# Patient Record
Sex: Male | Born: 1940 | Race: White | Hispanic: No | Marital: Single | Smoking: Former smoker
Health system: Southern US, Community
[De-identification: ages and names within clinical notes are randomized; demographics above are authoritative.]

## PROBLEM LIST (undated history)

## (undated) DIAGNOSIS — I429 Cardiomyopathy, unspecified: Secondary | ICD-10-CM

## (undated) DIAGNOSIS — K56609 Unspecified intestinal obstruction, unspecified as to partial versus complete obstruction: Secondary | ICD-10-CM

## (undated) DIAGNOSIS — M109 Gout, unspecified: Secondary | ICD-10-CM

## (undated) DIAGNOSIS — I10 Essential (primary) hypertension: Secondary | ICD-10-CM

## (undated) DIAGNOSIS — K469 Unspecified abdominal hernia without obstruction or gangrene: Secondary | ICD-10-CM

## (undated) DIAGNOSIS — I4891 Unspecified atrial fibrillation: Secondary | ICD-10-CM

## (undated) DIAGNOSIS — E785 Hyperlipidemia, unspecified: Secondary | ICD-10-CM

## (undated) DIAGNOSIS — M1990 Unspecified osteoarthritis, unspecified site: Secondary | ICD-10-CM

## (undated) HISTORY — PX: VENTRAL HERNIA REPAIR: SHX424

## (undated) HISTORY — PX: APPENDECTOMY: SHX54

## (undated) HISTORY — DX: Unspecified osteoarthritis, unspecified site: M19.90

## (undated) HISTORY — DX: Hyperlipidemia, unspecified: E78.5

## (undated) HISTORY — DX: Essential (primary) hypertension: I10

## (undated) HISTORY — PX: EYE SURGERY: SHX253

## (undated) HISTORY — DX: Cardiomyopathy, unspecified: I42.9

## (undated) HISTORY — DX: Gout, unspecified: M10.9

---

## 2007-02-14 ENCOUNTER — Observation Stay (HOSPITAL_COMMUNITY): Admission: EM | Admit: 2007-02-14 | Discharge: 2007-02-15 | Payer: Self-pay | Admitting: Emergency Medicine

## 2007-02-14 ENCOUNTER — Encounter (INDEPENDENT_AMBULATORY_CARE_PROVIDER_SITE_OTHER): Payer: Self-pay | Admitting: General Surgery

## 2007-03-29 ENCOUNTER — Observation Stay (HOSPITAL_COMMUNITY): Admission: RE | Admit: 2007-03-29 | Discharge: 2007-03-30 | Payer: Self-pay | Admitting: General Surgery

## 2009-08-06 ENCOUNTER — Emergency Department (HOSPITAL_COMMUNITY): Admission: EM | Admit: 2009-08-06 | Discharge: 2009-08-07 | Payer: Self-pay | Admitting: Emergency Medicine

## 2009-12-26 ENCOUNTER — Inpatient Hospital Stay (HOSPITAL_COMMUNITY): Admission: EM | Admit: 2009-12-26 | Discharge: 2009-12-28 | Payer: Self-pay | Admitting: Emergency Medicine

## 2010-07-02 ENCOUNTER — Emergency Department (HOSPITAL_COMMUNITY)
Admission: EM | Admit: 2010-07-02 | Discharge: 2010-07-02 | Disposition: A | Discharge status: Home / Self Care | Payer: Medicare PPO | Attending: Emergency Medicine | Admitting: Emergency Medicine

## 2010-07-02 ENCOUNTER — Emergency Department (HOSPITAL_COMMUNITY): Payer: Medicare PPO

## 2010-07-02 DIAGNOSIS — M25469 Effusion, unspecified knee: Secondary | ICD-10-CM | POA: Insufficient documentation

## 2010-07-02 DIAGNOSIS — M25569 Pain in unspecified knee: Secondary | ICD-10-CM | POA: Insufficient documentation

## 2010-07-02 DIAGNOSIS — E785 Hyperlipidemia, unspecified: Secondary | ICD-10-CM | POA: Insufficient documentation

## 2010-07-02 DIAGNOSIS — Z79899 Other long term (current) drug therapy: Secondary | ICD-10-CM | POA: Insufficient documentation

## 2010-07-02 DIAGNOSIS — I10 Essential (primary) hypertension: Secondary | ICD-10-CM | POA: Insufficient documentation

## 2010-07-02 DIAGNOSIS — M109 Gout, unspecified: Secondary | ICD-10-CM | POA: Insufficient documentation

## 2010-07-04 LAB — COMPREHENSIVE METABOLIC PANEL
AST: 26 U/L (ref 0–37)
AST: 30 U/L (ref 0–37)
Albumin: 3.6 g/dL (ref 3.5–5.2)
Alkaline Phosphatase: 47 U/L (ref 39–117)
Alkaline Phosphatase: 49 U/L (ref 39–117)
Alkaline Phosphatase: 59 U/L (ref 39–117)
CO2: 25 mEq/L (ref 19–32)
CO2: 26 mEq/L (ref 19–32)
CO2: 28 mEq/L (ref 19–32)
Calcium: 10.1 mg/dL (ref 8.4–10.5)
Chloride: 105 mEq/L (ref 96–112)
Chloride: 107 mEq/L (ref 96–112)
Creatinine, Ser: 1.04 mg/dL (ref 0.4–1.5)
Creatinine, Ser: 1.38 mg/dL (ref 0.4–1.5)
Creatinine, Ser: 1.48 mg/dL (ref 0.4–1.5)
GFR calc Af Amer: >60 mL/min (ref >60)
GFR calc non Af Amer: 47 mL/min — ABNORMAL LOW (ref >60)
Glucose, Bld: 100 mg/dL — ABNORMAL HIGH (ref 70–99)
Potassium: 4.2 mEq/L (ref 3.5–5.1)
Potassium: 4.7 mEq/L (ref 3.5–5.1)
Sodium: 138 mEq/L (ref 135–145)
Sodium: 139 mEq/L (ref 135–145)
Total Bilirubin: 0.6 mg/dL (ref 0.3–1.2)
Total Bilirubin: 0.8 mg/dL (ref 0.3–1.2)
Total Protein: 7.2 g/dL (ref 6.0–8.3)

## 2010-07-04 LAB — URINALYSIS, ROUTINE W REFLEX MICROSCOPIC
Hgb urine dipstick: NEGATIVE
Ketones, ur: 15 mg/dL — AB
Nitrite: NEGATIVE
Specific Gravity, Urine: >1.03 — ABNORMAL HIGH (ref 1.005–1.030)

## 2010-07-04 LAB — CBC
Hemoglobin: 12.3 g/dL — ABNORMAL LOW (ref 13.0–17.0)
MCH: 30.3 pg (ref 26.0–34.0)
MCH: 30.9 pg (ref 26.0–34.0)
MCV: 90.9 fL (ref 78.0–100.0)
Platelets: 280 10*3/uL (ref 150–400)
RBC: 3.98 MIL/uL — ABNORMAL LOW (ref 4.22–5.81)
RBC: 4.27 MIL/uL (ref 4.22–5.81)
RDW: 15.2 % (ref 11.5–15.5)
WBC: 7.2 10*3/uL (ref 4.0–10.5)

## 2010-07-04 LAB — DIFFERENTIAL
Basophils Absolute: 0 10*3/uL (ref 0.0–0.1)
Basophils Relative: 1 % (ref 0–1)
Eosinophils Absolute: 0 10*3/uL (ref 0.0–0.7)
Eosinophils Relative: 5 % (ref 0–5)
Lymphocytes Relative: 26 % (ref 12–46)
Lymphs Abs: 1.1 10*3/uL (ref 0.7–4.0)
Monocytes Absolute: 0.9 10*3/uL (ref 0.1–1.0)
Monocytes Relative: 11 % (ref 3–12)
Monocytes Relative: 6 % (ref 3–12)
Neutrophils Relative %: 58 % (ref 43–77)

## 2010-07-04 LAB — RAPID URINE DRUG SCREEN, HOSP PERFORMED
Amphetamines: NOT DETECTED
Benzodiazepines: NOT DETECTED
Cocaine: NOT DETECTED
Opiates: NOT DETECTED

## 2010-07-09 LAB — CBC
HCT: 45.6 % (ref 39.0–52.0)
MCHC: 34.5 g/dL (ref 30.0–36.0)
Platelets: 242 10*3/uL (ref 150–400)
RDW: 14.5 % (ref 11.5–15.5)

## 2010-07-09 LAB — DIFFERENTIAL
Eosinophils Relative: 1 % (ref 0–5)
Lymphocytes Relative: 11 % — ABNORMAL LOW (ref 12–46)
Lymphs Abs: 1.6 10*3/uL (ref 0.7–4.0)
Monocytes Relative: 6 % (ref 3–12)
Neutro Abs: 12.4 10*3/uL — ABNORMAL HIGH (ref 1.7–7.7)

## 2010-07-09 LAB — BASIC METABOLIC PANEL
Chloride: 103 mEq/L (ref 96–112)
GFR calc Af Amer: 57 mL/min — ABNORMAL LOW (ref >60)
GFR calc non Af Amer: 47 mL/min — ABNORMAL LOW (ref >60)

## 2010-09-03 NOTE — Op Note (Signed)
NAMEEUGUNE, SINE                 ACCOUNT NO.:  [id number]   MEDICAL RECORD NO.:  [id number]          PATIENT TYPE:  INP   LOCATION:  A331                          FACILITY:  [facility]   PHYSICIAN:  Dalia Heading, M.D.  DATE OF BIRTH:  [birth year]   DATE OF PROCEDURE:  02/14/2007  DATE OF DISCHARGE:                               OPERATIVE REPORT   PREOPERATIVE DIAGNOSIS:  Acute appendicitis.   POSTOPERATIVE DIAGNOSIS:  Acute appendicitis, Meckel's diverticulum.   PROCEDURE:  Laparoscopic appendectomy, laparoscopic Meckel's  diverticulectomy.   SURGEON:  Dr. Franky Macho.   ANESTHESIA:  General endotracheal.   INDICATIONS:  The patient is a 70 year old white male who presents with  worsening right lower quadrant abdominal pain.  CT scan of the pelvis  reveals acute appendicitis.  Risks and benefits of the procedure  including bleeding, infection, and the possibly an open procedure were  fully explained to the patient, gave informed consent.   PROCEDURE NOTE:  The patient is placed in the supine position.  After  induction of general endotracheal anesthesia, the abdomen was prepped  and draped in the usual sterile technique with Betadine.  Surgical site  confirmation was performed.   The supraumbilical incision was made down to fascia.  The Veress needle  was introduced into the abdominal cavity and confirmation of placement  was done using the saline drop test.  The abdomen was then insufflated  to 16 mmHg pressure.  11 mm trocar was introduced into the abdominal  cavity under direct visualization without difficulty.  An additional 12-  mm trocars placed in suprapubic region and 5-mm trocars placed in the  right lower quadrant and left lower quadrant regions.  The appendix was  visualized and noted to be acutely inflamed without evidence of  perforation.  The mesoappendix was divided using the harmonic scalpel.  A standard Endo-GIA was placed across the base the appendix and  fired.  The appendix was then removed using the EndoCatch bag.  On further  inspection of the bowel, the patient was noted have a Meckel's  diverticulum with evidence of inflammation at its tip.  The Meckel's  diverticulum was excised using an Endo-GIA.  The Meckel's was moved  using an EndoCatch bag.  The staple line was inspected, noted to be  normal limits.  The right lower quadrant of the abdomen was copiously  irrigated with normal saline.  All fluid and air were then evacuated  from the abdominal cavity prior to removal of the trocars.   All wounds irrigated with normal saline.  All wounds were injected with  0.5% Sensorcaine.  The patient was noted to have an umbilical hernia and  omentum was reduced from this area.  It could not be repaired primarily  and was felt the patient would best have it repaired as an outpatient  and he has been asymptomatic.  The supraumbilical fascia was  reapproximated using an 0 Vicryl interrupted suture.  All skin incisions  were closed using staples.  Betadine ointment dry sterile dressings were  applied.   All tape and needle counts correct  the end of procedure.  The patient  was extubated in the operating room went back to recovery room awake in  stable condition.   COMPLICATIONS:  None.   SPECIMEN:  1. Appendix.  2. Meckel's diverticulum.   BLOOD LOSS:  Minimal.      Dalia Heading, M.D.  Electronically Signed     MAJ/MEDQ  D:  02/14/2007  T:  02/15/2007  Job:  [id number]   cc:   Joen Laura, MD  [address] [place], [place] [address]

## 2010-09-03 NOTE — H&P (Signed)
NAMECHRITOPHER, Bryce Cook                 ACCOUNT NO.:  [id number]   MEDICAL RECORD NO.:  [id number]          PATIENT TYPE:  AMB   LOCATION:  DAY                           FACILITY:  [facility]   PHYSICIAN:  Dalia Heading, M.D.  DATE OF BIRTH:  [birth year]   DATE OF ADMISSION:  03/29/2007  DATE OF DISCHARGE:  LH                              HISTORY & PHYSICAL   CHIEF COMPLAINT:  Umbilical hernia.   HISTORY OF PRESENT ILLNESS:  The patient is a 70 year old white male who  recently underwent a laparoscopic appendectomy and diverticulectomy for  acute appendicitis, who was also noted to have a large umbilical hernia.  It could not be repaired at that time due to the need for mesh placement  and the fact that the patient had acute appendicitis.  He now presents  for a laparoscopic umbilical herniorrhaphy.   PAST MEDICAL HISTORY:  1. Hypertension.  2. Gout.   PAST SURGICAL HISTORY:  As noted above.   CURRENT MEDICATIONS:  Flomax, Crestor, Diovan,  bisoprolol/hydrochlorothiazide, lisinopril, colchicine.   ALLERGIES:  No known drug allergies.   REVIEW OF SYSTEMS:  The patient denies any recent chest pain, MI, CVA or  diabetes mellitus.   PHYSICAL EXAMINATION:  The patient is a well-developed, well-nourished  white male in no acute distress.  LUNGS:  Clear to auscultation with equal breath sounds bilaterally.  HEART:  A regular rate and rhythm without S3, S4 or murmurs.  ABDOMEN:  Soft with a large, reducible umbilical hernia present.  No  hepatosplenomegaly or masses are noted.   IMPRESSION:  Umbilical hernia.   PLAN:  The patient is scheduled for a laparoscopic umbilical  herniorrhaphy with mesh on March 29, 2007.  The risks and benefits of  the procedure including bleeding, infection, bowel injury, recurrence of  the hernia, and the possibility of an open procedure, were fully  explained to the patient, who gave informed consent.      Dalia Heading, M.D.  Electronically  Signed     MAJ/MEDQ  D:  03/25/2007  T:  03/25/2007  Job:  [id number]   cc:   [facility]  Fax: [phone]   Marjean Donna  Fax: [phone]

## 2010-09-03 NOTE — Op Note (Signed)
NAMEDIRK, Bryce Cook                 ACCOUNT NO.:  [id number]   MEDICAL RECORD NO.:  [id number]          PATIENT TYPE:  OBV   LOCATION:  A325                          FACILITY:  [facility]   PHYSICIAN:  Dalia Heading, M.D.  DATE OF BIRTH:  20-Jul-1940   DATE OF PROCEDURE:  03/29/2007  DATE OF DISCHARGE:                               OPERATIVE REPORT   PREOPERATIVE DIAGNOSIS:  Ventral hernia.   POSTOPERATIVE DIAGNOSIS:  Ventral hernia.   PROCEDURE:  Laparoscopic ventral herniorrhaphy with mesh.   SURGEON:  Dalia Heading, MD   ANESTHESIA:  General endotracheal.   INDICATIONS:  The patient is a 70 year old morbidly obese white male who  presents with an umbilical/ventral hernia.  The risks and benefits of  the procedure including bleeding, infection, bowel injury, and the  possibly of an open procedure as well as recurrence of the hernia were  fully explained to the patient, who gave informed consent.   PROCEDURE NOTE:  The patient was placed in the supine position.  After  induction of general endotracheal anesthesia, the abdomen was prepped  and draped using the usual sterile technique with Betadine.  Surgical  site confirmation was performed.   An incision was made in the epigastric region and a Veress needle was  inserted into the abdominal cavity without difficulty.  Confirmation was  done using the saline drop test.  The abdomen was then insufflated to 16  mmHg pressure.  An 11-mm trocar was then introduced into the left upper  quadrant of the abdomen without difficulty.  An additional 5-mm trocar  was placed on the right side of the abdomen and left lower quadrant  region.  Omentum was noted be attached to the underside of the  umbilicus.  This was divided using the LigaSure.  Next, a 10 x 15-cm  Proceed mesh was then inserted.  It was tacked the abdominal wall in  four areas using 2-0 Novofil interrupted sutures.  A Pro Tacker was then  used to tack the edges to the underside  of the abdominal wall.  No  abnormal bleeding was noted at the end of the procedure.  All air was  evacuated from the abdominal cavity prior to the removal of the trocars.   All wounds were irrigated with normal saline.  All wounds were injected  with 0.5% Sensorcaine.  The 10-mm trocar site fascia was reapproximated  using a 0 Vicryl interrupted suture.  All skin incisions were closed  using staples.  Dermabond was applied to the stay sutures sites.   All tape and needle counts were correct at the end of the procedure.  The patient was extubated in the operating room and went back to the  recovery room awake, in stable condition.   COMPLICATIONS:  None.   SPECIMEN:  None.   BLOOD LOSS:  Minimal.      Dalia Heading, M.D.  Electronically Signed     MAJ/MEDQ  D:  03/29/2007  T:  03/30/2007  Job:  [id number]   cc:   Marjean Donna  Fax: [phone]

## 2011-01-27 LAB — BASIC METABOLIC PANEL
CO2: 29
Calcium: 9.6
Chloride: 104
Creatinine, Ser: 1
GFR calc Af Amer: >60
GFR calc non Af Amer: >60
Glucose, Bld: 99

## 2011-01-27 LAB — CBC
HCT: 38.6 — ABNORMAL LOW
Hemoglobin: 13
RBC: 4.38
WBC: 5.9

## 2011-01-27 LAB — DIFFERENTIAL
Basophils Absolute: 0
Lymphs Abs: 1.8
Monocytes Absolute: 0.8

## 2011-01-29 LAB — DIFFERENTIAL
Basophils Absolute: 0
Basophils Relative: 0
Eosinophils Relative: 1
Lymphocytes Relative: 9 — ABNORMAL LOW
Lymphs Abs: 0.5 — ABNORMAL LOW
Monocytes Absolute: 0.1 — ABNORMAL LOW
Neutro Abs: 8.3 — ABNORMAL HIGH
Neutrophils Relative %: 95 — ABNORMAL HIGH

## 2011-01-29 LAB — URINALYSIS, ROUTINE W REFLEX MICROSCOPIC
Bilirubin Urine: NEGATIVE
Hgb urine dipstick: NEGATIVE
Nitrite: NEGATIVE
Protein, ur: NEGATIVE
Urobilinogen, UA: 0.2

## 2011-01-29 LAB — BASIC METABOLIC PANEL
BUN: 22
BUN: 22
CO2: 25
Calcium: 9.5
Creatinine, Ser: 1.34
GFR calc Af Amer: 56 — ABNORMAL LOW
GFR calc non Af Amer: 46 — ABNORMAL LOW
Glucose, Bld: 141 — ABNORMAL HIGH
Potassium: 4.2
Sodium: 137

## 2011-01-29 LAB — CBC
HCT: 35.8 — ABNORMAL LOW
MCHC: 34.3
Platelets: 123 — ABNORMAL LOW
Platelets: 195
RDW: 14.1 — ABNORMAL HIGH
WBC: 5.9

## 2012-01-01 ENCOUNTER — Encounter: Payer: Self-pay | Admitting: Cardiology

## 2012-01-01 ENCOUNTER — Ambulatory Visit (INDEPENDENT_AMBULATORY_CARE_PROVIDER_SITE_OTHER): Payer: Medicare PPO | Admitting: Cardiology

## 2012-01-01 VITALS — BP 130/82 | HR 73 | Ht 71.0 in | Wt 249.0 lb

## 2012-01-01 DIAGNOSIS — E785 Hyperlipidemia, unspecified: Secondary | ICD-10-CM | POA: Insufficient documentation

## 2012-01-01 DIAGNOSIS — I429 Cardiomyopathy, unspecified: Secondary | ICD-10-CM

## 2012-01-01 DIAGNOSIS — I10 Essential (primary) hypertension: Secondary | ICD-10-CM

## 2012-01-01 DIAGNOSIS — E782 Mixed hyperlipidemia: Secondary | ICD-10-CM

## 2012-01-01 NOTE — Patient Instructions (Addendum)
Your physician recommends that you schedule a follow-up appointment in: 6 months  

## 2012-01-01 NOTE — Assessment & Plan Note (Signed)
Continue on statin therapy. Recent lipid numbers reviewed.

## 2012-01-01 NOTE — Progress Notes (Signed)
Clinical Summary Bryce Cook is a 71 y.o.male presents to establish cardiology followup. Outside records reviewed. 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 There is no definitive history of obstructive CAD or myocardial infarction as best I can tell.  Recent labwork showed potassium 5.2, BUN 16, creatinine 0.9, normal AST and ALT, cholesterol 189, triglycerides 245, HDL 51, and LDL 89. Exercise echocardiogram report from 1/13 showed no definite echocardiographic ischemic changes at 81% MPHR, EF 60%, PVC's, no diagnostic ST changes.  From a symptom perspective, he describes occasional exertional chest pain, although typically no more than once every few months. Has baseline NYHA class II dyspnea on exertion. No palpitations or syncope.  He reports compliance with his medications.   Not on File  Current Outpatient Prescriptions  Medication Sig Dispense Refill  . allopurinol (ZYLOPRIM) 100 MG tablet Take 200 mg by mouth daily.       Marland Kitchen amLODipine (NORVASC) 5 MG tablet Take 5 mg by mouth daily.       Marland Kitchen aspirin 325 MG EC tablet Take 325 mg by mouth every other day.      . fexofenadine (ALLEGRA) 180 MG tablet Take 90 mg by mouth daily.      Marland Kitchen lisinopril (PRINIVIL,ZESTRIL) 20 MG tablet Take 20 mg by mouth daily.      . CRESTOR 10 MG tablet Take 10 mg by mouth daily.         Past Medical History  Diagnosis Date  . Arthritis   . Hyperlipidemia   . Gout   . Essential hypertension, benign   . Cardiomyopathy     LVEF of 40-45% 2005, LVEF 60% 2013    Past Surgical History  Procedure Date  . Ventral hernia repair   . Appendectomy     Family History  Problem Relation Age of Onset  . Heart failure Mother   . Hypertension Mother   . Hyperlipidemia Mother   . Coronary artery disease Father     Died age 64 with MI  . Hypertension Father   . Hyperlipidemia Father     Social History [name]  reports that he quit smoking about 34 years ago. His smoking use included Cigarettes. He does not have any smokeless tobacco history on file. [name] reports that he drinks alcohol.  Review of Systems No palpitations or syncope. No reported bleeding episodes. No orthopnea or PND. Reports pain in his legs related to gout, no definite claudication. Otherwise negative.  Physical Examination Filed Vitals:   01/01/12 1436  BP: 130/82  Pulse: 73   Filed Weights   01/01/12 1436  Weight: 249 lb (112.946 kg)   Patient in no acute distress. HEENT: Conjunctiva and lids normal, oropharynx clear. Neck: Supple, no elevated JVP or carotid bruits, no thyromegaly. Lungs: Clear to auscultation, nonlabored breathing at rest. Cardiac: Regular rate and rhythm, no S3 1-2/6 systolic murmur at base, no pericardial rub. Abdomen: Soft, nontender, protuberant, bowel sounds present, no guarding or rebound. Extremities: No pitting edema, distal pulses 2+. Skin: Warm and dry. Musculoskeletal: No kyphosis. Neuropsychiatric: Alert and oriented x3, affect grossly appropriate.   Problem List and Plan   Cardiomyopathy secondary Based on review of outside records. Patient seems to be stable from a symptom perspective. He does have occasional chest pain symptoms, may have some associated underlying CAD, however exercise echocardiogram from January of this year was relatively low risk at 81% MPHR. For now I've  asked him to be observant for any escalation in symptoms that might warrant further evaluation. Otherwise continue observation on medical therapy.  Essential hypertension, benign No changes made to current regimen.  Mixed hyperlipidemia Continue on statin therapy. Recent lipid numbers reviewed.    Jonelle Sidle, M.D., F.A.C.C.

## 2012-01-01 NOTE — Assessment & Plan Note (Signed)
No changes made to current regimen. 

## 2012-01-01 NOTE — Assessment & Plan Note (Signed)
Based on review of outside records. Patient seems to be stable from a symptom perspective. He does have occasional chest pain symptoms, may have some associated underlying CAD, however exercise echocardiogram from January of this year was relatively low risk at 81% MPHR. For now I've asked him to be observant for any escalation in symptoms that might warrant further evaluation. Otherwise continue observation on medical therapy.

## 2012-04-03 ENCOUNTER — Inpatient Hospital Stay (HOSPITAL_COMMUNITY)
Admission: EM | Admit: 2012-04-03 | Discharge: 2012-04-05 | DRG: 389 | Disposition: A | Discharge status: Home / Self Care | Payer: Medicare PPO | Attending: General Surgery | Admitting: General Surgery

## 2012-04-03 ENCOUNTER — Emergency Department (HOSPITAL_COMMUNITY): Payer: Medicare PPO

## 2012-04-03 ENCOUNTER — Encounter (HOSPITAL_COMMUNITY): Payer: Self-pay

## 2012-04-03 DIAGNOSIS — I10 Essential (primary) hypertension: Secondary | ICD-10-CM | POA: Diagnosis present

## 2012-04-03 DIAGNOSIS — M109 Gout, unspecified: Secondary | ICD-10-CM | POA: Diagnosis present

## 2012-04-03 DIAGNOSIS — K56609 Unspecified intestinal obstruction, unspecified as to partial versus complete obstruction: Secondary | ICD-10-CM

## 2012-04-03 DIAGNOSIS — I428 Other cardiomyopathies: Secondary | ICD-10-CM | POA: Diagnosis present

## 2012-04-03 DIAGNOSIS — K565 Intestinal adhesions [bands], unspecified as to partial versus complete obstruction: Principal | ICD-10-CM | POA: Diagnosis present

## 2012-04-03 DIAGNOSIS — Z87891 Personal history of nicotine dependence: Secondary | ICD-10-CM

## 2012-04-03 DIAGNOSIS — Z6834 Body mass index (BMI) 34.0-34.9, adult: Secondary | ICD-10-CM

## 2012-04-03 DIAGNOSIS — E785 Hyperlipidemia, unspecified: Secondary | ICD-10-CM | POA: Diagnosis present

## 2012-04-03 DIAGNOSIS — E669 Obesity, unspecified: Secondary | ICD-10-CM | POA: Diagnosis present

## 2012-04-03 DIAGNOSIS — M129 Arthropathy, unspecified: Secondary | ICD-10-CM | POA: Diagnosis present

## 2012-04-03 LAB — URINALYSIS, ROUTINE W REFLEX MICROSCOPIC
Glucose, UA: NEGATIVE mg/dL
Ketones, ur: 15 mg/dL — AB
Leukocytes, UA: NEGATIVE
pH: 6 (ref 5.0–8.0)

## 2012-04-03 LAB — CBC WITH DIFFERENTIAL/PLATELET
Eosinophils Relative: 0 % (ref 0–5)
HCT: 46.5 % (ref 39.0–52.0)
Lymphocytes Relative: 12 % (ref 12–46)
Lymphs Abs: 1.4 10*3/uL (ref 0.7–4.0)
MCV: 89.3 fL (ref 78.0–100.0)
Monocytes Absolute: 1.1 10*3/uL — ABNORMAL HIGH (ref 0.1–1.0)
Monocytes Relative: 9 % (ref 3–12)
RBC: 5.21 MIL/uL (ref 4.22–5.81)
RDW: 14.5 % (ref 11.5–15.5)
WBC: 11.8 10*3/uL — ABNORMAL HIGH (ref 4.0–10.5)

## 2012-04-03 LAB — URINE MICROSCOPIC-ADD ON

## 2012-04-03 LAB — BASIC METABOLIC PANEL
BUN: 25 mg/dL — ABNORMAL HIGH (ref 6–23)
CO2: 29 mEq/L (ref 19–32)
Calcium: 10.1 mg/dL (ref 8.4–10.5)
Creatinine, Ser: 1.19 mg/dL (ref 0.50–1.35)
Glucose, Bld: 138 mg/dL — ABNORMAL HIGH (ref 70–99)

## 2012-04-03 MED ORDER — MORPHINE SULFATE 4 MG/ML IJ SOLN
4.0000 mg | Freq: Once | INTRAMUSCULAR | Status: AC
  Administered 2012-04-03: 4 mg via INTRAVENOUS
  Filled 2012-04-03: qty 1

## 2012-04-03 MED ORDER — PANTOPRAZOLE SODIUM 40 MG IV SOLR
40.0000 mg | Freq: Every day | INTRAVENOUS | Status: DC
  Administered 2012-04-03 – 2012-04-04 (×2): 40 mg via INTRAVENOUS
  Filled 2012-04-03 (×2): qty 40

## 2012-04-03 MED ORDER — LACTATED RINGERS IV SOLN
INTRAVENOUS | Status: DC
  Administered 2012-04-03 – 2012-04-05 (×4): via INTRAVENOUS

## 2012-04-03 MED ORDER — ENOXAPARIN SODIUM 40 MG/0.4ML ~~LOC~~ SOLN
40.0000 mg | SUBCUTANEOUS | Status: DC
  Administered 2012-04-03 – 2012-04-04 (×2): 40 mg via SUBCUTANEOUS
  Filled 2012-04-03 (×2): qty 0.4

## 2012-04-03 MED ORDER — ONDANSETRON HCL 4 MG/2ML IJ SOLN
INTRAMUSCULAR | Status: AC
  Filled 2012-04-03: qty 2

## 2012-04-03 MED ORDER — ONDANSETRON HCL 4 MG/2ML IJ SOLN
4.0000 mg | Freq: Once | INTRAMUSCULAR | Status: AC
  Administered 2012-04-03: 4 mg via INTRAVENOUS
  Filled 2012-04-03: qty 2

## 2012-04-03 MED ORDER — ONDANSETRON HCL 4 MG/2ML IJ SOLN
4.0000 mg | Freq: Four times a day (QID) | INTRAMUSCULAR | Status: DC | PRN
  Administered 2012-04-03 (×2): 4 mg via INTRAVENOUS
  Filled 2012-04-03: qty 2

## 2012-04-03 MED ORDER — ENOXAPARIN SODIUM 40 MG/0.4ML ~~LOC~~ SOLN
SUBCUTANEOUS | Status: AC
  Filled 2012-04-03: qty 0.4

## 2012-04-03 MED ORDER — MORPHINE SULFATE 2 MG/ML IJ SOLN
1.0000 mg | INTRAMUSCULAR | Status: DC | PRN
  Administered 2012-04-03: 4 mg via INTRAVENOUS
  Administered 2012-04-03: 2 mg via INTRAVENOUS
  Filled 2012-04-03: qty 2
  Filled 2012-04-03: qty 1

## 2012-04-03 MED ORDER — ONDANSETRON HCL 4 MG/2ML IJ SOLN: 4.0000 mg | Freq: Once | INTRAMUSCULAR | Status: DC

## 2012-04-03 MED ORDER — SODIUM CHLORIDE 0.9 % IV BOLUS (SEPSIS)
700.0000 mL | Freq: Once | INTRAVENOUS | Status: AC
  Administered 2012-04-03: 1000 mL via INTRAVENOUS

## 2012-04-03 MED ORDER — IOHEXOL 300 MG/ML  SOLN
100.0000 mL | Freq: Once | INTRAMUSCULAR | Status: AC | PRN
  Administered 2012-04-03: 100 mL via INTRAVENOUS

## 2012-04-03 NOTE — ED Notes (Signed)
Bladder scan was completed on pt. Scan measured 0(zero) ml. RN made aware

## 2012-04-03 NOTE — H&P (Signed)
Bryce Cook is an 71 y.o. male.   Chief Complaint: Nausea and vomiting HPI: Patient presents to [facility] emergency department with increasing nausea, one episode of emesis and decreased bowel function. He has had some colicky abdominal discomfort. This is progress over the last 48 hours. He has not had any hematemesis. He did pass some flatus yesterday but has not had any significant bowel movement over the last several days. He denies any melena or hematochezia with his last bowel movement. He did have a similar episode approximately one year ago. This resolved with conservative management. Currently patient denies any abdominal pain. He denies any fevers or chills.  Past Medical History  Diagnosis Date  . Arthritis   . Hyperlipidemia   . Gout   . Essential hypertension, benign   . Cardiomyopathy     LVEF of 40-45% 2005, LVEF 60% 2013    Past Surgical History  Procedure Date  . Ventral hernia repair   . Appendectomy     Family History  Problem Relation Age of Onset  . Heart failure Mother   . Hypertension Mother   . Hyperlipidemia Mother   . Coronary artery disease Father     Died age 41 with MI  . Hypertension Father   . Hyperlipidemia Father    Social History:  reports that he quit smoking about 34 years ago. His smoking use included Cigarettes. He does not have any smokeless tobacco history on file. He reports that he drinks alcohol. He reports that he does not use illicit drugs.  Allergies: No Known Allergies   (Not in a hospital admission)  Results for orders placed during the hospital encounter of 04/03/12 (from the past 48 hour(s))  CBC WITH DIFFERENTIAL     Status: Abnormal   Collection Time   04/03/12  9:21 AM      Component Value Range Comment   WBC 11.8 (*) 4.0 - 10.5 K/uL    RBC 5.21  4.22 - 5.81 MIL/uL    Hemoglobin 16.0  13.0 - 17.0 g/dL    HCT 69.6  29.5 - 28.4 %    MCV 89.3  78.0 - 100.0 fL    MCH 30.7  26.0 - 34.0 pg    MCHC 34.4  30.0 - 36.0 g/dL     RDW 13.2  44.0 - 10.2 %    Platelets 245  150 - 400 K/uL    Neutrophils Relative 78 (*) 43 - 77 %    Neutro Abs 9.3 (*) 1.7 - 7.7 K/uL    Lymphocytes Relative 12  12 - 46 %    Lymphs Abs 1.4  0.7 - 4.0 K/uL    Monocytes Relative 9  3 - 12 %    Monocytes Absolute 1.1 (*) 0.1 - 1.0 K/uL    Eosinophils Relative 0  0 - 5 %    Eosinophils Absolute 0.0  0.0 - 0.7 K/uL    Basophils Relative 0  0 - 1 %    Basophils Absolute 0.0  0.0 - 0.1 K/uL   BASIC METABOLIC PANEL     Status: Abnormal   Collection Time   04/03/12  9:21 AM      Component Value Range Comment   Sodium 136  135 - 145 mEq/L    Potassium 4.0  3.5 - 5.1 mEq/L    Chloride 96  96 - 112 mEq/L    CO2 29  19 - 32 mEq/L    Glucose, Bld 138 (*) 70 -  99 mg/dL    BUN 25 (*) 6 - 23 mg/dL    Creatinine, Ser 1.61  0.50 - 1.35 mg/dL    Calcium 09.6  8.4 - 10.5 mg/dL    GFR calc non Af Amer 60 (*) >90 mL/min    GFR calc Af Amer 70 (*) >90 mL/min   TROPONIN I     Status: Normal   Collection Time   04/03/12  9:21 AM      Component Value Range Comment   Troponin I <0.30  <0.30 ng/mL   URINALYSIS, ROUTINE W REFLEX MICROSCOPIC     Status: Abnormal   Collection Time   04/03/12 10:30 AM      Component Value Range Comment   Color, Urine YELLOW  YELLOW    APPearance HAZY (*) CLEAR    Specific Gravity, Urine 1.025  1.005 - 1.030    pH 6.0  5.0 - 8.0    Glucose, UA NEGATIVE  NEGATIVE mg/dL    Hgb urine dipstick NEGATIVE  NEGATIVE    Bilirubin Urine SMALL (*) NEGATIVE    Ketones, ur 15 (*) NEGATIVE mg/dL    Protein, ur 30 (*) NEGATIVE mg/dL    Urobilinogen, UA 0.2  0.0 - 1.0 mg/dL    Nitrite NEGATIVE  NEGATIVE    Leukocytes, UA NEGATIVE  NEGATIVE   URINE MICROSCOPIC-ADD ON     Status: Abnormal   Collection Time   04/03/12 10:30 AM      Component Value Range Comment   WBC, UA 7-10  <3 WBC/hpf    Bacteria, UA FEW (*) RARE    Casts HYALINE CASTS (*) NEGATIVE    Ct Abdomen Pelvis W Contrast  04/03/2012  *RADIOLOGY REPORT*   Clinical Data: Lower abdominal pain.  Previous umbilical hernia repair.  Small bowel obstruction.  CT ABDOMEN AND PELVIS WITH CONTRAST  Technique:  Multidetector CT imaging of the abdomen and pelvis was performed following the standard protocol during bolus administration of intravenous contrast.  Contrast: OMNIPAQUE IOHEXOL 300 MG/ML  SOLN  Comparison: 12/26/2009  Findings: Mild to moderate dilatation of proximal small bowel loops is seen with air-fluid levels.  Small bowel feces sign is seen, with transition point in the periumbilical region just deep to surgical mesh in the anterior abdominal wall.  Distal small bowel is nondilated.  This is consistent with a small bowel obstruction likely due to an adhesion.  No soft tissue mass or inflammatory process identified.  Minimal free fluid is noted the pelvis.  No evidence of bowel wall thickening or pneumatosis.  No evidence of inflammatory process or abscess.  Colonic diverticulosis is seen, however there is no evidence of diverticulitis.  Diffuse hepatic steatosis is demonstrated, but no liver mass identified.  Gallbladder is unremarkable.  The pancreas, spleen, adrenal glands, and kidneys are normal in appearance.  No evidence of hydronephrosis.  IMPRESSION:  1.  Distal small bowel obstruction, with transition point in the periumbilical region just deep to the internal abdominal wall surgical mesh.  This is consistent with an adhesion. 2.  Small amount of free fluid.  No evidence of inflammatory process or abscess. 3.  Diverticulosis.  No radiographic evidence of diverticulitis. 4.  Hepatic steatosis.   Original Report Authenticated By: Myles Rosenthal, M.D.    Dg Abd Acute W/chest  04/03/2012  *RADIOLOGY REPORT*  Clinical Data: Lower abdominal pain, constipation  ACUTE ABDOMEN SERIES (ABDOMEN 2 VIEW & CHEST 1 VIEW)  Comparison: CT abdomen pelvis dated 12/26/2009  Findings: Lungs are  clear. No pleural effusion or pneumothorax.  Mild cardiomegaly, although the  appearance is exacerbated by prominent epicardial fat when correlating with prior CT.  Mildly dilated loops of small bowel in the left mid abdomen, measuring up to 4.5 cm, with multiple air-fluid levels on the upright radiograph.  This appearance is suspicious for small bowel obstruction.  No evidence of free air under the diaphragm on the upright view.  Prior right ventral hernia mesh repair.  IMPRESSION: No evidence of acute cardiopulmonary disease.  Findings suspicious for small bowel obstruction.  No free air.   Original Report Authenticated By: Charline Bills, M.D.     Review of Systems  Constitutional: Positive for chills. Negative for fever, weight loss, malaise/fatigue and diaphoresis.  HENT: Negative.   Eyes: Negative.   Respiratory: Negative.   Cardiovascular: Negative.   Gastrointestinal: Positive for nausea and vomiting. Negative for abdominal pain, diarrhea, constipation, blood in stool and melena.  Genitourinary: Negative.   Musculoskeletal: Negative.   Skin: Negative.   Neurological: Negative.  Negative for weakness.  Endo/Heme/Allergies: Negative.   Psychiatric/Behavioral: Negative.     Blood pressure 128/78, pulse 74, temperature 98.7 F (37.1 C), temperature source Oral, resp. rate 20, SpO2 94.00%. Physical Exam  Constitutional: He is oriented to person, place, and time. He appears well-developed and well-nourished. No distress.       Obese  HENT:  Head: Normocephalic and atraumatic.  Eyes: Conjunctivae normal and EOM are normal. Pupils are equal, round, and reactive to light. No scleral icterus.  Neck: Normal range of motion. Neck supple. No tracheal deviation present. No thyromegaly present.  Cardiovascular: Normal rate, regular rhythm and normal heart sounds.   Respiratory: Effort normal and breath sounds normal.  GI: Soft. He exhibits distension. He exhibits no mass. There is no tenderness. There is no rebound and no guarding.       Tympanic  Musculoskeletal:  Normal range of motion.  Lymphadenopathy:    He has no cervical adenopathy.  Neurological: He is alert and oriented to person, place, and time.  Skin: Skin is warm and dry.     Assessment/Plan Small bowel obstruction. At this time patient will be continued in an n.p.o. status. Continue IV fluid hydration. Surgical indications were discussed with the patient. Patient agrees and wishes to proceed with conservative management if possible. We'll continue DVT prophylaxis. Inpatient admission.  Jilberto Vanderwall C 04/03/2012, 2:16 PM

## 2012-04-03 NOTE — ED Provider Notes (Signed)
History   This chart was scribed for Ward Givens, MD by Melba Coon, ED Scribe. The patient was seen in room APA08/APA08 and the patient's care was started at 9:30AM.    CSN: [id number]  Arrival date & time 04/03/12  0855   None     Chief Complaint  Patient presents with  . Abdominal Pain    (Consider location/radiation/quality/duration/timing/severity/associated sxs/prior treatment) The history is provided by the patient. No language interpreter was used.   Bryce Cook is a 71 y.o. male who presents to the Emergency Department complaining of intermittent, moderate peri-umbilical abdominal pain with a gradual onset 2:00-3:00PM yesterday that has gotten progressively worse. When the pain is present,the pain last 45 seconds and occurs every 5 minutes. He reports constipation for the past couple of weeks; last BM was this morning and stool was hard to come out with straining and was hard in consistancy. Denies change in medications or diet.  Movement or change in posittions aggravate the pain. He reports slow urine flow. Denies HA, fever, neck pain, sore throat, rash, back pain, CP, SOB, nausea, emesis, diarrhea, dysuria, or extremity pain, edema, weakness, numbness, or tingling. He was started  on supplemental O2 (2L, [place]) at home when he sleeps that was started in the past couple of weeks. He has had this pain before and was told it was gas pains.    No known allergies. No other pertinent medical symptoms.  PCP: Dr. Verlan Friends in [place], [place] Cardio: Dr. Diona Browner (was sent  for cardiomegaly) next appt in March  Past Medical History  Diagnosis Date  . Arthritis   . Hyperlipidemia   . Gout   . Essential hypertension, benign   . Cardiomyopathy     LVEF of 40-45% 2005, LVEF 60% 2013    Past Surgical History  Procedure Date  . Ventral hernia repair   . Appendectomy     Family History  Problem Relation Age of Onset  . Heart failure Mother   . Hypertension Mother   .  Hyperlipidemia Mother   . Coronary artery disease Father     Died age 107 with MI  . Hypertension Father   . Hyperlipidemia Father     History  Substance Use Topics  . Smoking status: Former Smoker    Types: Cigarettes    Quit date: 04/21/1977  . Smokeless tobacco: Not on file  . Alcohol Use: Yes     Comment: Occasionally   Lives at home Lives alone   Review of Systems 10 Systems reviewed and all are negative for acute change except as noted in the HPI.   Allergies  Review of patient's allergies indicates no known allergies.  Home Medications   Current Outpatient Rx  Name  Route  Sig  Dispense  Refill  . ALLOPURINOL 100 MG PO TABS   Oral   Take 200 mg by mouth daily.          Marland Kitchen AMLODIPINE BESYLATE 5 MG PO TABS   Oral   Take 5 mg by mouth daily.          . ASPIRIN 325 MG PO TBEC   Oral   Take 325 mg by mouth every other day.         . CRESTOR 10 MG PO TABS   Oral   Take 10 mg by mouth daily.          Marland Kitchen FEXOFENADINE HCL 180 MG PO TABS   Oral   Take 90  mg by mouth daily.         Marland Kitchen LISINOPRIL 20 MG PO TABS   Oral   Take 20 mg by mouth daily.           BP 135/51  Pulse 40  Temp 98.7 F (37.1 C) (Oral)  Resp 18  SpO2 96%  Vital signs normal except bradycardia (on monitor heart rate is actually in the 70s)   Physical Exam  Constitutional: He is oriented to person, place, and time. He appears well-developed and well-nourished.  Non-toxic appearance. He does not appear ill. No distress.  HENT:  Head: Normocephalic and atraumatic.  Right Ear: External ear normal.  Left Ear: External ear normal.  Nose: Nose normal. No mucosal edema or rhinorrhea.  Mouth/Throat: Oropharynx is clear and moist and mucous membranes are normal. No dental abscesses or uvula swelling.  Eyes: Conjunctivae normal and EOM are normal. Pupils are equal, round, and reactive to light.  Neck: Normal range of motion and full passive range of motion without pain. Neck  supple.  Cardiovascular: Normal rate, regular rhythm and normal heart sounds.  Exam reveals no gallop and no friction rub.   No murmur heard. Pulmonary/Chest: Effort normal and breath sounds normal. No respiratory distress. He has no wheezes. He has no rhonchi. He has no rales. He exhibits no tenderness and no crepitus.  Abdominal: Normal appearance and bowel sounds are normal. He exhibits no distension. There is Tenderness: suprapubic.. There is no rebound and no guarding.       Overall soft, but there is firmness lateral to the umbilicus c/w mesh  Musculoskeletal: Normal range of motion. He exhibits no edema and no tenderness.       Moves all extremities well.   Neurological: He is alert and oriented to person, place, and time. He has normal strength. No cranial nerve deficit.  Skin: Skin is warm, dry and intact. No rash noted. No erythema. No pallor.  Psychiatric: He has a normal mood and affect. His speech is normal and behavior is normal. His mood appears not anxious.    ED Course  Procedures (including critical care time)   Medications  morphine 2 MG/ML injection 1-4 mg (not administered)  ondansetron (ZOFRAN) injection 4 mg (4 mg Intravenous Given 04/03/12 1406)  pantoprazole (PROTONIX) injection 40 mg (not administered)  ondansetron (ZOFRAN) 4 MG/2ML injection (not administered)  ondansetron (ZOFRAN) injection 4 mg (not administered)  morphine 4 MG/ML injection 4 mg (4 mg Intravenous Given 04/03/12 1126)  ondansetron (ZOFRAN) injection 4 mg (4 mg Intravenous Given 04/03/12 1126)  sodium chloride 0.9 % bolus 700 mL (0 mL Intravenous Stopped 04/03/12 1145)  iohexol (OMNIPAQUE) 300 MG/ML solution 100 mL (100 mL Intravenous Contrast Given 04/03/12 1239)     DIAGNOSTIC STUDIES: Oxygen Saturation is 96% on room air, adequate by my interpretation.    COORDINATION OF CARE:  9:37AM - EKG, CXR, bladder scan, blood w/u, and UA will be ordered for Bryce Cook.   Bladder scan was  0  11:00AM - imaging results reviewed  11:20AM - lab results reviewed  11:33AM - recheck pain better after meds.  Discussed xray results and lab results. CT and more fluids will be ordered.  13:56 pt just vomited green liquid, his first episode of vomiting most likely from his contrast for his CT scan. Pt given results of his CT scan and need for admission.   13:57 Dr Leticia Penna will admit, states can wait for NG tube for now.  Results for orders placed during the hospital encounter of 04/03/12  CBC WITH DIFFERENTIAL      Component Value Range   WBC 11.8 (*) 4.0 - 10.5 K/uL   RBC 5.21  4.22 - 5.81 MIL/uL   Hemoglobin 16.0  13.0 - 17.0 g/dL   HCT 16.1  09.6 - 04.5 %   MCV 89.3  78.0 - 100.0 fL   MCH 30.7  26.0 - 34.0 pg   MCHC 34.4  30.0 - 36.0 g/dL   RDW 40.9  81.1 - 91.4 %   Platelets 245  150 - 400 K/uL   Neutrophils Relative 78 (*) 43 - 77 %   Neutro Abs 9.3 (*) 1.7 - 7.7 K/uL   Lymphocytes Relative 12  12 - 46 %   Lymphs Abs 1.4  0.7 - 4.0 K/uL   Monocytes Relative 9  3 - 12 %   Monocytes Absolute 1.1 (*) 0.1 - 1.0 K/uL   Eosinophils Relative 0  0 - 5 %   Eosinophils Absolute 0.0  0.0 - 0.7 K/uL   Basophils Relative 0  0 - 1 %   Basophils Absolute 0.0  0.0 - 0.1 K/uL  BASIC METABOLIC PANEL      Component Value Range   Sodium 136  135 - 145 mEq/L   Potassium 4.0  3.5 - 5.1 mEq/L   Chloride 96  96 - 112 mEq/L   CO2 29  19 - 32 mEq/L   Glucose, Bld 138 (*) 70 - 99 mg/dL   BUN 25 (*) 6 - 23 mg/dL   Creatinine, Ser 7.82  0.50 - 1.35 mg/dL   Calcium 95.6  8.4 - 21.3 mg/dL   GFR calc non Af Amer 60 (*) >90 mL/min   GFR calc Af Amer 70 (*) >90 mL/min  TROPONIN I      Component Value Range   Troponin I <0.30  <0.30 ng/mL  URINALYSIS, ROUTINE W REFLEX MICROSCOPIC      Component Value Range   Color, Urine YELLOW  YELLOW   APPearance HAZY (*) CLEAR   Specific Gravity, Urine 1.025  1.005 - 1.030   pH 6.0  5.0 - 8.0   Glucose, UA NEGATIVE  NEGATIVE mg/dL   Hgb urine  dipstick NEGATIVE  NEGATIVE   Bilirubin Urine SMALL (*) NEGATIVE   Ketones, ur 15 (*) NEGATIVE mg/dL   Protein, ur 30 (*) NEGATIVE mg/dL   Urobilinogen, UA 0.2  0.0 - 1.0 mg/dL   Nitrite NEGATIVE  NEGATIVE   Leukocytes, UA NEGATIVE  NEGATIVE  URINE MICROSCOPIC-ADD ON      Component Value Range   WBC, UA 7-10  <3 WBC/hpf   Bacteria, UA FEW (*) RARE   Casts HYALINE CASTS (*) NEGATIVE   Laboratory interpretation all normal except leukocytosis    Ct Abdomen Pelvis W Contrast  04/03/2012  *RADIOLOGY REPORT*  Clinical Data: Lower abdominal pain.  Previous umbilical hernia repair.  Small bowel obstruction.  CT ABDOMEN AND PELVIS WITH CONTRAST  Technique:  Multidetector CT imaging of the abdomen and pelvis was performed following the standard protocol during bolus administration of intravenous contrast.  Contrast: OMNIPAQUE IOHEXOL 300 MG/ML  SOLN  Comparison: 12/26/2009  Findings: Mild to moderate dilatation of proximal small bowel loops is seen with air-fluid levels.  Small bowel feces sign is seen, with transition point in the periumbilical region just deep to surgical mesh in the anterior abdominal wall.  Distal small bowel is nondilated.  This is consistent with  a small bowel obstruction likely due to an adhesion.  No soft tissue mass or inflammatory process identified.  Minimal free fluid is noted the pelvis.  No evidence of bowel wall thickening or pneumatosis.  No evidence of inflammatory process or abscess.  Colonic diverticulosis is seen, however there is no evidence of diverticulitis.  Diffuse hepatic steatosis is demonstrated, but no liver mass identified.  Gallbladder is unremarkable.  The pancreas, spleen, adrenal glands, and kidneys are normal in appearance.  No evidence of hydronephrosis.  IMPRESSION:  1.  Distal small bowel obstruction, with transition point in the periumbilical region just deep to the internal abdominal wall surgical mesh.  This is consistent with an adhesion. 2.   Small amount of free fluid.  No evidence of inflammatory process or abscess. 3.  Diverticulosis.  No radiographic evidence of diverticulitis. 4.  Hepatic steatosis.   Original Report Authenticated By: Myles Rosenthal, M.D.    Dg Abd Acute W/chest  04/03/2012  *RADIOLOGY REPORT*  Clinical Data: Lower abdominal pain, constipation  ACUTE ABDOMEN SERIES (ABDOMEN 2 VIEW & CHEST 1 VIEW)  Comparison: CT abdomen pelvis dated 12/26/2009  Findings: Lungs are clear. No pleural effusion or pneumothorax.  Mild cardiomegaly, although the appearance is exacerbated by prominent epicardial fat when correlating with prior CT.  Mildly dilated loops of small bowel in the left mid abdomen, measuring up to 4.5 cm, with multiple air-fluid levels on the upright radiograph.  This appearance is suspicious for small bowel obstruction.  No evidence of free air under the diaphragm on the upright view.  Prior right ventral hernia mesh repair.  IMPRESSION: No evidence of acute cardiopulmonary disease.  Findings suspicious for small bowel obstruction.  No free air.   Original Report Authenticated By: Charline Bills, M.D.      Date: 04/03/2012  Rate: 78  Rhythm: normal sinus rhythm and premature ventricular contractions (PVC)  QRS Axis: normal  Intervals: normal  ST/T Wave abnormalities: normal  Conduction Disutrbances:none  Narrative Interpretation:   Old EKG Reviewed: none available    1. SBO (small bowel obstruction)    Plan admission  Devoria Albe, MD, FACEP   CRITICAL CARE Performed by: Devoria Albe L   Total critical care time: 32 min   Critical care time was exclusive of separately billable procedures and treating other patients.  Critical care was necessary to treat or prevent imminent or life-threatening deterioration.  Critical care was time spent personally by me on the following activities: development of treatment plan with patient and/or surrogate as well as nursing, discussions with consultants, evaluation  of patient's response to treatment, examination of patient, obtaining history from patient or surrogate, ordering and performing treatments and interventions, ordering and review of laboratory studies, ordering and review of radiographic studies, pulse oximetry and re-evaluation of patient's condition.     MDM   I personally performed the services described in this documentation, which was scribed in my presence. The recorded information has been reviewed and considered.  Devoria Albe, MD, Armando Gang        Ward Givens, MD 04/03/12 [phone]

## 2012-04-03 NOTE — ED Notes (Signed)
Pt reports ab pain that started yesterday , no other symptoms, last bm this am, normal

## 2012-04-03 NOTE — ED Notes (Signed)
edp in. Pt states he has felt constipated x 1 week and stool hard to come out this am. nad

## 2012-04-03 NOTE — ED Notes (Signed)
Pt states, "I feel a whole lot better now".  denies nausea; denies pain.

## 2012-04-04 LAB — BASIC METABOLIC PANEL
CO2: 27 mEq/L (ref 19–32)
Calcium: 8.6 mg/dL (ref 8.4–10.5)
Creatinine, Ser: 1.19 mg/dL (ref 0.50–1.35)
GFR calc non Af Amer: 60 mL/min — ABNORMAL LOW (ref >90)
Glucose, Bld: 104 mg/dL — ABNORMAL HIGH (ref 70–99)
Sodium: 136 mEq/L (ref 135–145)

## 2012-04-04 LAB — URINE CULTURE: Colony Count: 4000

## 2012-04-04 LAB — CBC
MCH: 29.8 pg (ref 26.0–34.0)
MCHC: 32.6 g/dL (ref 30.0–36.0)
MCV: 91.3 fL (ref 78.0–100.0)
Platelets: 194 10*3/uL (ref 150–400)
RDW: 15 % (ref 11.5–15.5)

## 2012-04-04 NOTE — Progress Notes (Signed)
Subjective: Increased bowel function. Large bowel movement this morning. No symptoms currently. No nausea. Patient states he feels hungry.  Objective: Vital signs in last 24 hours: Temp:  [97.5 F (36.4 C)-97.9 F (36.6 C)] 97.9 F (36.6 C) (12/15 0500) Pulse Rate:  [60-74] 60  (12/15 0500) Resp:  [16-21] 16  (12/15 0500) BP: (122-162)/(59-78) 122/66 mmHg (12/15 0500) SpO2:  [92 %-95 %] 93 % (12/15 0500) Weight:  [113.399 kg (250 lb)] 113.399 kg (250 lb) (12/14 1605) Last BM Date: 04/03/12  Intake/Output from previous day: 12/14 0701 - 12/15 0700 In: -  Out: 700 [Urine:600; Emesis/NG output:100] Intake/Output this shift:    General appearance: alert and no distress GI: Positive bowel sounds, soft, obese, nontender.  Lab Results:   [facility] 04/04/12 0633 04/03/12 0921  WBC 7.6 11.8*  HGB 12.4* 16.0  HCT 38.0* 46.5  PLT 194 245   BMET  Basename 04/04/12 0633 04/03/12 0921  NA 136 136  K 4.2 4.0  CL 103 96  CO2 27 29  GLUCOSE 104* 138*  BUN 25* 25*  CREATININE 1.19 1.19  CALCIUM 8.6 10.1   PT/INR No results found for this basename: LABPROT:2,INR:2 in the last 72 hours ABG No results found for this basename: PHART:2,PCO2:2,PO2:2,HCO3:2 in the last 72 hours  Studies/Results: Ct Abdomen Pelvis W Contrast  04/03/2012  *RADIOLOGY REPORT*  Clinical Data: Lower abdominal pain.  Previous umbilical hernia repair.  Small bowel obstruction.  CT ABDOMEN AND PELVIS WITH CONTRAST  Technique:  Multidetector CT imaging of the abdomen and pelvis was performed following the standard protocol during bolus administration of intravenous contrast.  Contrast: OMNIPAQUE IOHEXOL 300 MG/ML  SOLN  Comparison: 12/26/2009  Findings: Mild to moderate dilatation of proximal small bowel loops is seen with air-fluid levels.  Small bowel feces sign is seen, with transition point in the periumbilical region just deep to surgical mesh in the anterior abdominal wall.  Distal small bowel is  nondilated.  This is consistent with a small bowel obstruction likely due to an adhesion.  No soft tissue mass or inflammatory process identified.  Minimal free fluid is noted the pelvis.  No evidence of bowel wall thickening or pneumatosis.  No evidence of inflammatory process or abscess.  Colonic diverticulosis is seen, however there is no evidence of diverticulitis.  Diffuse hepatic steatosis is demonstrated, but no liver mass identified.  Gallbladder is unremarkable.  The pancreas, spleen, adrenal glands, and kidneys are normal in appearance.  No evidence of hydronephrosis.  IMPRESSION:  1.  Distal small bowel obstruction, with transition point in the periumbilical region just deep to the internal abdominal wall surgical mesh.  This is consistent with an adhesion. 2.  Small amount of free fluid.  No evidence of inflammatory process or abscess. 3.  Diverticulosis.  No radiographic evidence of diverticulitis. 4.  Hepatic steatosis.   Original Report Authenticated By: Myles Rosenthal, M.D.    Dg Abd Acute W/chest  04/03/2012  *RADIOLOGY REPORT*  Clinical Data: Lower abdominal pain, constipation  ACUTE ABDOMEN SERIES (ABDOMEN 2 VIEW & CHEST 1 VIEW)  Comparison: CT abdomen pelvis dated 12/26/2009  Findings: Lungs are clear. No pleural effusion or pneumothorax.  Mild cardiomegaly, although the appearance is exacerbated by prominent epicardial fat when correlating with prior CT.  Mildly dilated loops of small bowel in the left mid abdomen, measuring up to 4.5 cm, with multiple air-fluid levels on the upright radiograph.  This appearance is suspicious for small bowel obstruction.  No evidence of free  air under the diaphragm on the upright view.  Prior right ventral hernia mesh repair.  IMPRESSION: No evidence of acute cardiopulmonary disease.  Findings suspicious for small bowel obstruction.  No free air.   Original Report Authenticated By: Charline Bills, M.D.     Anti-infectives: Anti-infectives    None       Assessment/Plan: s/p * No surgery found * SBO resolving.  Start clears.  Advance diet as tolerated.  Possible discharge later today.    LOS: 1 day    Buster Schueller C 04/04/2012

## 2012-04-05 LAB — BASIC METABOLIC PANEL
Calcium: 8.9 mg/dL (ref 8.4–10.5)
Creatinine, Ser: 1.04 mg/dL (ref 0.50–1.35)
GFR calc Af Amer: 82 mL/min — ABNORMAL LOW (ref >90)
GFR calc non Af Amer: 71 mL/min — ABNORMAL LOW (ref >90)
Sodium: 138 mEq/L (ref 135–145)

## 2012-04-05 LAB — CBC
HCT: 39.9 % (ref 39.0–52.0)
MCV: 91.1 fL (ref 78.0–100.0)
RBC: 4.38 MIL/uL (ref 4.22–5.81)
WBC: 6.7 10*3/uL (ref 4.0–10.5)

## 2012-04-05 MED ORDER — SIMETHICONE 80 MG PO CHEW: 80.0000 mg | CHEWABLE_TABLET | Freq: Four times a day (QID) | ORAL | Status: DC | PRN

## 2012-04-05 NOTE — Progress Notes (Signed)
Patient with orders to be discharge to home. Discharge instructions given, patient verbalized understanding vi ateach back method. Patient in stable condition upon discharge. Patient left with family in private vehicle.

## 2012-04-05 NOTE — Discharge Summary (Signed)
Physician Discharge Summary  Patient ID: Bryce Cook MRN: [id number] DOB/AGE: [birth year] 70 y.o.  Admit date: 04/03/2012 Discharge date: 04/05/2012  Admission Diagnoses: Small bowel obstruction  Discharge Diagnoses: Resolved small bowel obstruction Active Problems:  * No active hospital problems. *    Discharged Condition: stable  Hospital Course: Patient presented to [facility] with increased abdominal bloating nausea and vomiting with some diffuse colicky abdominal pain. Workup and evaluation was consistent for a small bowel obstruction suspected likely secondarily to adhesions. He was treated conservatively. He began having increased bowel function with positive flatus and stool. He was slowly advanced back on a diet. He is tolerating regular diet without any difficulties. Patient was made ready for discharge. Patient is instructed to continue low residual diet.  Consults: None  Significant Diagnostic Studies: radiology: CT scan: Abdomen and pelvis  Treatments: IV hydration  Discharge Exam: Blood pressure 149/75, pulse 65, temperature 98 F (36.7 C), temperature source Oral, resp. rate 20, height 5\' 11"  (1.803 m), weight 113.399 kg (250 lb), SpO2 92.00%. General appearance: alert and no distress Resp: clear to auscultation bilaterally Cardio: regular rate and rhythm GI: soft, non-tender; bowel sounds normal; no masses,  no organomegaly  Disposition: 01-Home or Self Care  Discharge Orders    Future Orders Please Complete By Expires   Diet - low sodium heart healthy      Increase activity slowly      Discharge instructions      Comments:   Increase activity as tolerated. May place ice pack for comfort.  Low residual diet       Medication List     As of 04/05/2012 11:37 AM    TAKE these medications         allopurinol 100 MG tablet   Commonly known as: ZYLOPRIM   Take 100 mg by mouth daily.      amLODipine 5 MG tablet   Commonly known as: NORVASC   Take 5 mg by mouth daily.      aspirin 325 MG EC tablet   Take 325 mg by mouth every other day.      cetirizine 10 MG tablet   Commonly known as: ZYRTEC   Take 10 mg by mouth daily as needed. Allergies      CRESTOR 10 MG tablet   Generic drug: rosuvastatin   Take 10 mg by mouth daily.      lisinopril 20 MG tablet   Commonly known as: PRINIVIL,ZESTRIL   Take 20 mg by mouth daily.      simethicone 80 MG chewable tablet   Commonly known as: MYLICON   Chew 1 tablet (80 mg total) by mouth 4 (four) times daily as needed for flatulence.      Tamsulosin HCl 0.4 MG Caps   Commonly known as: FLOMAX   Take 0.4 mg by mouth at bedtime.         SignedFabio Bering 04/05/2012, 11:37 AM

## 2012-04-05 NOTE — Care Management Note (Signed)
    Page 1 of 1   04/05/2012     11:47:36 AM   CARE MANAGEMENT NOTE 04/05/2012  Patient:  Bryce Cook, Bryce Cook   Account Number:  [id number]  Date Initiated:  04/05/2012  Documentation initiated by:  Sharrie Rothman  Subjective/Objective Assessment:   Pt admitted from home with SBO. Pt lives alone and is very independent with ADL's.     Action/Plan:   No CM or HH needs noted.   Anticipated DC Date:  04/05/2012   Anticipated DC Plan:  HOME/SELF CARE      DC Planning Services  CM consult      Choice offered to / List presented to:             Status of service:  Completed, signed off Medicare Important Message given?  YES (If response is "NO", the following Medicare IM given date fields will be blank) Date Medicare IM given:  04/05/2012 Date Additional Medicare IM given:    Discharge Disposition:  HOME/SELF CARE  Per UR Regulation:    If discussed at Long Length of Stay Meetings, dates discussed:    Comments:

## 2012-04-06 NOTE — Progress Notes (Signed)
UR Chart Review Completed  

## 2013-07-03 ENCOUNTER — Emergency Department (HOSPITAL_COMMUNITY): Payer: Medicare PPO

## 2013-07-03 ENCOUNTER — Inpatient Hospital Stay (HOSPITAL_COMMUNITY)
Admission: EM | Admit: 2013-07-03 | Discharge: 2013-07-06 | DRG: 389 | Disposition: A | Discharge status: Home / Self Care | Payer: Medicare PPO | Attending: General Surgery | Admitting: General Surgery

## 2013-07-03 ENCOUNTER — Encounter (HOSPITAL_COMMUNITY): Payer: Self-pay | Admitting: Emergency Medicine

## 2013-07-03 DIAGNOSIS — M129 Arthropathy, unspecified: Secondary | ICD-10-CM | POA: Diagnosis present

## 2013-07-03 DIAGNOSIS — E785 Hyperlipidemia, unspecified: Secondary | ICD-10-CM | POA: Diagnosis present

## 2013-07-03 DIAGNOSIS — I10 Essential (primary) hypertension: Secondary | ICD-10-CM | POA: Diagnosis present

## 2013-07-03 DIAGNOSIS — Z8249 Family history of ischemic heart disease and other diseases of the circulatory system: Secondary | ICD-10-CM

## 2013-07-03 DIAGNOSIS — Z87891 Personal history of nicotine dependence: Secondary | ICD-10-CM

## 2013-07-03 DIAGNOSIS — K56609 Unspecified intestinal obstruction, unspecified as to partial versus complete obstruction: Principal | ICD-10-CM | POA: Diagnosis present

## 2013-07-03 DIAGNOSIS — M109 Gout, unspecified: Secondary | ICD-10-CM | POA: Diagnosis present

## 2013-07-03 DIAGNOSIS — I428 Other cardiomyopathies: Secondary | ICD-10-CM | POA: Diagnosis present

## 2013-07-03 LAB — URINALYSIS, ROUTINE W REFLEX MICROSCOPIC
BILIRUBIN URINE: NEGATIVE
GLUCOSE, UA: NEGATIVE mg/dL
HGB URINE DIPSTICK: NEGATIVE
Ketones, ur: NEGATIVE mg/dL
Leukocytes, UA: NEGATIVE
Nitrite: NEGATIVE
PROTEIN: 30 mg/dL — AB
Urobilinogen, UA: 0.2 mg/dL (ref 0.0–1.0)
pH: 6 (ref 5.0–8.0)

## 2013-07-03 LAB — COMPREHENSIVE METABOLIC PANEL
ALK PHOS: 71 U/L (ref 39–117)
ALT: 38 U/L (ref 0–53)
AST: 29 U/L (ref 0–37)
Albumin: 4.5 g/dL (ref 3.5–5.2)
BUN: 21 mg/dL (ref 6–23)
CALCIUM: 10.3 mg/dL (ref 8.4–10.5)
CO2: 28 meq/L (ref 19–32)
Chloride: 98 mEq/L (ref 96–112)
Creatinine, Ser: 1.17 mg/dL (ref 0.50–1.35)
GFR, EST AFRICAN AMERICAN: 71 mL/min — AB (ref >90)
GFR, EST NON AFRICAN AMERICAN: 61 mL/min — AB (ref >90)
GLUCOSE: 141 mg/dL — AB (ref 70–99)
POTASSIUM: 4.7 meq/L (ref 3.7–5.3)
SODIUM: 141 meq/L (ref 137–147)
Total Bilirubin: 0.4 mg/dL (ref 0.3–1.2)
Total Protein: 8.3 g/dL (ref 6.0–8.3)

## 2013-07-03 LAB — CBC WITH DIFFERENTIAL/PLATELET
BASOS ABS: 0 10*3/uL (ref 0.0–0.1)
Basophils Relative: 0 % (ref 0–1)
EOS PCT: 1 % (ref 0–5)
Eosinophils Absolute: 0.2 10*3/uL (ref 0.0–0.7)
HCT: 47.5 % (ref 39.0–52.0)
Hemoglobin: 15.8 g/dL (ref 13.0–17.0)
LYMPHS ABS: 2.5 10*3/uL (ref 0.7–4.0)
LYMPHS PCT: 18 % (ref 12–46)
MCH: 29.5 pg (ref 26.0–34.0)
MCHC: 33.3 g/dL (ref 30.0–36.0)
MCV: 88.6 fL (ref 78.0–100.0)
Monocytes Absolute: 1.1 10*3/uL — ABNORMAL HIGH (ref 0.1–1.0)
Monocytes Relative: 8 % (ref 3–12)
NEUTROS ABS: 10.2 10*3/uL — AB (ref 1.7–7.7)
NEUTROS PCT: 73 % (ref 43–77)
PLATELETS: 250 10*3/uL (ref 150–400)
RBC: 5.36 MIL/uL (ref 4.22–5.81)
RDW: 15.3 % (ref 11.5–15.5)
WBC: 14 10*3/uL — AB (ref 4.0–10.5)

## 2013-07-03 LAB — URINE MICROSCOPIC-ADD ON

## 2013-07-03 LAB — LIPASE, BLOOD: Lipase: 36 U/L (ref 11–59)

## 2013-07-03 MED ORDER — ENOXAPARIN SODIUM 40 MG/0.4ML ~~LOC~~ SOLN
40.0000 mg | SUBCUTANEOUS | Status: DC
  Administered 2013-07-04 – 2013-07-05 (×2): 40 mg via SUBCUTANEOUS
  Filled 2013-07-03 (×3): qty 0.4

## 2013-07-03 MED ORDER — DIPHENHYDRAMINE HCL 50 MG/ML IJ SOLN
12.5000 mg | Freq: Four times a day (QID) | INTRAMUSCULAR | Status: DC | PRN
  Administered 2013-07-03: 12.5 mg via INTRAVENOUS
  Filled 2013-07-03: qty 1

## 2013-07-03 MED ORDER — DIPHENHYDRAMINE HCL 12.5 MG/5ML PO ELIX: 12.5000 mg | ORAL_SOLUTION | Freq: Four times a day (QID) | ORAL | Status: DC | PRN

## 2013-07-03 MED ORDER — ACETAMINOPHEN 650 MG RE SUPP: 650.0000 mg | Freq: Four times a day (QID) | RECTAL | Status: DC | PRN

## 2013-07-03 MED ORDER — LIDOCAINE VISCOUS 2 % MT SOLN
OROMUCOSAL | Status: AC
  Filled 2013-07-03: qty 15

## 2013-07-03 MED ORDER — HYDROMORPHONE HCL PF 1 MG/ML IJ SOLN
1.0000 mg | Freq: Once | INTRAMUSCULAR | Status: AC
  Administered 2013-07-03: 1 mg via INTRAVENOUS
  Filled 2013-07-03: qty 1

## 2013-07-03 MED ORDER — SODIUM CHLORIDE 0.9 % IV SOLN
1000.0000 mL | INTRAVENOUS | Status: DC
  Administered 2013-07-03: 1000 mL via INTRAVENOUS

## 2013-07-03 MED ORDER — LACTATED RINGERS IV SOLN
INTRAVENOUS | Status: DC
  Administered 2013-07-03 – 2013-07-05 (×4): via INTRAVENOUS

## 2013-07-03 MED ORDER — MORPHINE SULFATE 2 MG/ML IJ SOLN
2.0000 mg | INTRAMUSCULAR | Status: DC | PRN
  Administered 2013-07-03: 2 mg via INTRAVENOUS
  Filled 2013-07-03: qty 1

## 2013-07-03 MED ORDER — ACETAMINOPHEN 325 MG PO TABS: 650.0000 mg | ORAL_TABLET | Freq: Four times a day (QID) | ORAL | Status: DC | PRN

## 2013-07-03 MED ORDER — ONDANSETRON HCL 4 MG/2ML IJ SOLN
4.0000 mg | Freq: Once | INTRAMUSCULAR | Status: AC
  Administered 2013-07-03: 4 mg via INTRAVENOUS
  Filled 2013-07-03: qty 2

## 2013-07-03 MED ORDER — ONDANSETRON HCL 4 MG/2ML IJ SOLN: 4.0000 mg | Freq: Four times a day (QID) | INTRAMUSCULAR | Status: DC | PRN

## 2013-07-03 NOTE — ED Provider Notes (Signed)
CSN: [id number]     Arrival date & time 07/03/13  1822 History   First MD Initiated Contact with Patient 07/03/13 1844     Chief Complaint  Patient presents with  . Abdominal Pain     (Consider location/radiation/quality/duration/timing/severity/associated sxs/prior Treatment) HPI Patient presents with abdominal pain, nausea, vomiting. Symptoms began approximately 9 hours ago, subtly. Since onset symptoms have progressed.  The pain is diffuse, sore, severe. Last bowel movement was several hours ago. Patient is in minimal relief with anything. Nausea started in the past hours. No associated chest pain, dyspnea, fever, chills, syncope. Patient has a notable history of prior SBO  Past Medical History  Diagnosis Date  . Arthritis   . Hyperlipidemia   . Gout   . Essential hypertension, benign   . Cardiomyopathy     LVEF of 40-45% 2005, LVEF 60% 2013   Past Surgical History  Procedure Laterality Date  . Ventral hernia repair    . Appendectomy     Family History  Problem Relation Age of Onset  . Heart failure Mother   . Hypertension Mother   . Hyperlipidemia Mother   . Coronary artery disease Father     Died age 72 with MI  . Hypertension Father   . Hyperlipidemia Father    History  Substance Use Topics  . Smoking status: Former Smoker    Types: Cigarettes    Quit date: 04/21/1977  . Smokeless tobacco: Not on file  . Alcohol Use: Yes     Comment: Occasionally    Review of Systems  Constitutional:       Per HPI, otherwise negative  HENT:       Per HPI, otherwise negative  Respiratory:       Per HPI, otherwise negative  Cardiovascular:       Per HPI, otherwise negative  Gastrointestinal: Positive for nausea, vomiting and abdominal pain.  Endocrine:       Negative aside from HPI  Genitourinary:       Neg aside from HPI   Musculoskeletal:       Per HPI, otherwise negative  Skin: Negative.   Neurological: Negative for syncope.      Allergies  Review of  patient's allergies indicates no known allergies.  Home Medications   Current Outpatient Rx  Name  Route  Sig  Dispense  Refill  . allopurinol (ZYLOPRIM) 100 MG tablet   Oral   Take 100 mg by mouth daily.          Marland Kitchen. amLODipine (NORVASC) 5 MG tablet   Oral   Take 5 mg by mouth daily.          Marland Kitchen. aspirin 325 MG EC tablet   Oral   Take 325 mg by mouth every other day.         . cetirizine (ZYRTEC) 10 MG tablet   Oral   Take 10 mg by mouth daily as needed. Allergies         . CRESTOR 10 MG tablet   Oral   Take 10 mg by mouth daily.          Marland Kitchen. lisinopril (PRINIVIL,ZESTRIL) 20 MG tablet   Oral   Take 20 mg by mouth daily.         . simethicone (MYLICON) 80 MG chewable tablet   Oral   Chew 1 tablet (80 mg total) by mouth 4 (four) times daily as needed for flatulence.   30 tablet   0   .  Tamsulosin HCl (FLOMAX) 0.4 MG CAPS   Oral   Take 0.4 mg by mouth at bedtime.          BP 140/79  Pulse 76  Temp(Src) 97.5 F (36.4 C)  Resp 18  Ht 5\' 11"  (1.803 m)  Wt 255 lb (115.667 kg)  BMI 35.58 kg/m2  SpO2 98% Physical Exam  Nursing note and vitals reviewed. Constitutional: He is oriented to person, place, and time. He appears well-developed. No distress.  HENT:  Head: Normocephalic and atraumatic.  Eyes: Conjunctivae and EOM are normal.  Cardiovascular: Normal rate and regular rhythm.   Pulmonary/Chest: Effort normal. No stridor. No respiratory distress.  Abdominal: He exhibits no distension.  Large abdomen, diffusely tender to palpation, with mild guarding  Musculoskeletal: He exhibits no edema.  Neurological: He is alert and oriented to person, place, and time.  Skin: Skin is warm and dry.  Psychiatric: He has a normal mood and affect.    ED Course  Procedures (including critical care time) Labs Review Labs Reviewed  CBC WITH DIFFERENTIAL  COMPREHENSIVE METABOLIC PANEL  LIPASE, BLOOD  URINALYSIS, ROUTINE W REFLEX MICROSCOPIC   Imaging Review No  results found.   Pulse oximetry 100% room air normal  I reviewed the patient's chart after the initial evaluation  9:25 PM Patient much better clinically after dilaudid. He and his wife are aware of all results.  I spoke with Dr. Lovell Sheehan, who will admit the patient.    Patient had NG placed w/o complication    MDM  Patient history of appendectomy, prior hernia repair and prior SBO now presents with acute abdominal pain.  Initially SBO is a consideration, and this was indeed, demonstrated on his CT scan.  Patient had substantial pain improvement here, remained afebrile, without other new complaints in the emergency department.  Patient reported admission for further evaluation and management.   Gerhard Munch, MD 07/03/13 [phone]

## 2013-07-03 NOTE — ED Notes (Signed)
Pt c/o lower abd pain and nausea. lbm-today at 3pm.

## 2013-07-04 LAB — MAGNESIUM: MAGNESIUM: 2.2 mg/dL (ref 1.5–2.5)

## 2013-07-04 LAB — BASIC METABOLIC PANEL
BUN: 21 mg/dL (ref 6–23)
CHLORIDE: 104 meq/L (ref 96–112)
CO2: 26 mEq/L (ref 19–32)
CREATININE: 0.94 mg/dL (ref 0.50–1.35)
Calcium: 9.4 mg/dL (ref 8.4–10.5)
GFR calc Af Amer: >90 mL/min (ref >90)
GFR calc non Af Amer: 82 mL/min — ABNORMAL LOW (ref >90)
Glucose, Bld: 133 mg/dL — ABNORMAL HIGH (ref 70–99)
Potassium: 4.8 mEq/L (ref 3.7–5.3)
Sodium: 141 mEq/L (ref 137–147)

## 2013-07-04 LAB — CBC
HCT: 45.3 % (ref 39.0–52.0)
Hemoglobin: 15.1 g/dL (ref 13.0–17.0)
MCH: 30.4 pg (ref 26.0–34.0)
MCHC: 33.3 g/dL (ref 30.0–36.0)
MCV: 91.1 fL (ref 78.0–100.0)
Platelets: 238 10*3/uL (ref 150–400)
RBC: 4.97 MIL/uL (ref 4.22–5.81)
RDW: 15.7 % — ABNORMAL HIGH (ref 11.5–15.5)
WBC: 10.6 10*3/uL — AB (ref 4.0–10.5)

## 2013-07-04 LAB — PHOSPHORUS: Phosphorus: 3.9 mg/dL (ref 2.3–4.6)

## 2013-07-04 MED ORDER — MENTHOL 3 MG MT LOZG
1.0000 | LOZENGE | OROMUCOSAL | Status: DC | PRN
  Administered 2013-07-04: 3 mg via ORAL
  Filled 2013-07-04: qty 9

## 2013-07-04 NOTE — H&P (Signed)
Bryce Cook is an 73 y.o. male.   Chief Complaint: Abdominal pain and emesis HPI: Patient is a 73 year old white male who presents with a two-day history of worsening abdominal pain, nausea, and emesis. He states his bowel movements have been normal, but he started having increasing abdominal pain and distention in the midportion of his abdomen. He has previously had a partial small bowel obstruction treated conservatively in 2013. He is had multiple abdominal surgeries in the past. He presented to the emergency room where a CT scan of the abdomen revealed a partial small bowel obstruction most likely secondary to adhesive disease. This appears similar to a CAT scan that was done in 2013. Once an NG tube was placed, the patient did have relief of his pain.  Past Medical History  Diagnosis Date  . Arthritis   . Hyperlipidemia   . Gout   . Essential hypertension, benign   . Cardiomyopathy     LVEF of 40-45% 2005, LVEF 60% 2013    Past Surgical History  Procedure Laterality Date  . Ventral hernia repair    . Appendectomy    . Eye surgery      Family History  Problem Relation Age of Onset  . Heart failure Mother   . Hypertension Mother   . Hyperlipidemia Mother   . Coronary artery disease Father     Died age 72 with MI  . Hypertension Father   . Hyperlipidemia Father    Social History:  reports that he quit smoking about 36 years ago. His smoking use included Cigarettes. He smoked 0.00 packs per day. He does not have any smokeless tobacco history on file. He reports that he drinks alcohol. He reports that he does not use illicit drugs.  Allergies: No Known Allergies  Medications Prior to Admission  Medication Sig Dispense Refill  . allopurinol (ZYLOPRIM) 100 MG tablet Take 100 mg by mouth daily.       Marland Kitchen amLODipine (NORVASC) 5 MG tablet Take 5 mg by mouth daily.       Marland Kitchen aspirin 325 MG EC tablet Take 325 mg by mouth every other day.      . cetirizine (ZYRTEC) 10 MG tablet Take 10  mg by mouth daily as needed. Allergies      . CRESTOR 10 MG tablet Take 10 mg by mouth daily.       Marland Kitchen lisinopril (PRINIVIL,ZESTRIL) 20 MG tablet Take 20 mg by mouth daily.        Results for orders placed during the hospital encounter of 07/03/13 (from the past 48 hour(s))  CBC WITH DIFFERENTIAL     Status: Abnormal   Collection Time    07/03/13  7:20 PM      Result Value Ref Range   WBC 14.0 (*) 4.0 - 10.5 K/uL   RBC 5.36  4.22 - 5.81 MIL/uL   Hemoglobin 15.8  13.0 - 17.0 g/dL   HCT 47.5  39.0 - 52.0 %   MCV 88.6  78.0 - 100.0 fL   MCH 29.5  26.0 - 34.0 pg   MCHC 33.3  30.0 - 36.0 g/dL   RDW 15.3  11.5 - 15.5 %   Platelets 250  150 - 400 K/uL   Neutrophils Relative % 73  43 - 77 %   Neutro Abs 10.2 (*) 1.7 - 7.7 K/uL   Lymphocytes Relative 18  12 - 46 %   Lymphs Abs 2.5  0.7 - 4.0 K/uL   Monocytes Relative  8  3 - 12 %   Monocytes Absolute 1.1 (*) 0.1 - 1.0 K/uL   Eosinophils Relative 1  0 - 5 %   Eosinophils Absolute 0.2  0.0 - 0.7 K/uL   Basophils Relative 0  0 - 1 %   Basophils Absolute 0.0  0.0 - 0.1 K/uL  COMPREHENSIVE METABOLIC PANEL     Status: Abnormal   Collection Time    07/03/13  7:20 PM      Result Value Ref Range   Sodium 141  137 - 147 mEq/L   Potassium 4.7  3.7 - 5.3 mEq/L   Chloride 98  96 - 112 mEq/L   CO2 28  19 - 32 mEq/L   Glucose, Bld 141 (*) 70 - 99 mg/dL   BUN 21  6 - 23 mg/dL   Creatinine, Ser 1.17  0.50 - 1.35 mg/dL   Calcium 10.3  8.4 - 10.5 mg/dL   Total Protein 8.3  6.0 - 8.3 g/dL   Albumin 4.5  3.5 - 5.2 g/dL   AST 29  0 - 37 U/L   ALT 38  0 - 53 U/L   Alkaline Phosphatase 71  39 - 117 U/L   Total Bilirubin 0.4  0.3 - 1.2 mg/dL   GFR calc non Af Amer 61 (*) >90 mL/min   GFR calc Af Amer 71 (*) >90 mL/min   Comment: (NOTE)     The eGFR has been calculated using the CKD EPI equation.     This calculation has not been validated in all clinical situations.     eGFR's persistently <90 mL/min signify possible Chronic Kidney     Disease.   LIPASE, BLOOD     Status: None   Collection Time    07/03/13  7:20 PM      Result Value Ref Range   Lipase 36  11 - 59 U/L  URINALYSIS, ROUTINE W REFLEX MICROSCOPIC     Status: Abnormal   Collection Time    07/03/13  7:27 PM      Result Value Ref Range   Color, Urine YELLOW  YELLOW   APPearance CLEAR  CLEAR   Specific Gravity, Urine >1.030 (*) 1.005 - 1.030   pH 6.0  5.0 - 8.0   Glucose, UA NEGATIVE  NEGATIVE mg/dL   Hgb urine dipstick NEGATIVE  NEGATIVE   Bilirubin Urine NEGATIVE  NEGATIVE   Ketones, ur NEGATIVE  NEGATIVE mg/dL   Protein, ur 30 (*) NEGATIVE mg/dL   Urobilinogen, UA 0.2  0.0 - 1.0 mg/dL   Nitrite NEGATIVE  NEGATIVE   Leukocytes, UA NEGATIVE  NEGATIVE  URINE MICROSCOPIC-ADD ON     Status: Abnormal   Collection Time    07/03/13  7:27 PM      Result Value Ref Range   Bacteria, UA FEW (*) RARE   Casts HYALINE CASTS (*) NEGATIVE  BASIC METABOLIC PANEL     Status: Abnormal   Collection Time    07/04/13  5:23 AM      Result Value Ref Range   Sodium 141  137 - 147 mEq/L   Potassium 4.8  3.7 - 5.3 mEq/L   Chloride 104  96 - 112 mEq/L   CO2 26  19 - 32 mEq/L   Glucose, Bld 133 (*) 70 - 99 mg/dL   BUN 21  6 - 23 mg/dL   Creatinine, Ser 0.94  0.50 - 1.35 mg/dL   Calcium 9.4  8.4 - 10.5 mg/dL  GFR calc non Af Amer 82 (*) >90 mL/min   GFR calc Af Amer >90  >90 mL/min   Comment: (NOTE)     The eGFR has been calculated using the CKD EPI equation.     This calculation has not been validated in all clinical situations.     eGFR's persistently <90 mL/min signify possible Chronic Kidney     Disease.  MAGNESIUM     Status: None   Collection Time    07/04/13  5:23 AM      Result Value Ref Range   Magnesium 2.2  1.5 - 2.5 mg/dL  PHOSPHORUS     Status: None   Collection Time    07/04/13  5:23 AM      Result Value Ref Range   Phosphorus 3.9  2.3 - 4.6 mg/dL  CBC     Status: Abnormal   Collection Time    07/04/13  5:23 AM      Result Value Ref Range   WBC 10.6  (*) 4.0 - 10.5 K/uL   RBC 4.97  4.22 - 5.81 MIL/uL   Hemoglobin 15.1  13.0 - 17.0 g/dL   HCT 10.2  72.5 - 36.6 %   MCV 91.1  78.0 - 100.0 fL   MCH 30.4  26.0 - 34.0 pg   MCHC 33.3  30.0 - 36.0 g/dL   RDW 44.0 (*) 34.7 - 42.5 %   Platelets 238  150 - 400 K/uL   Ct Abdomen Pelvis Wo Contrast  07/03/2013   CLINICAL DATA:  Mid abdominal pain.  EXAM: CT ABDOMEN AND PELVIS WITHOUT CONTRAST  TECHNIQUE: Multidetector CT imaging of the abdomen and pelvis was performed following the standard protocol without intravenous contrast.  COMPARISON:  CT scan dated 04/03/2012 and 12/26/2009  FINDINGS: There is a small pleural plaque laterally in the right hemi thorax seen best on image 3 of series 2, unchanged since 12/26/2009. Heart size is normal. Scattered coronary artery calcifications.  There is hepatic steatosis. No focal lesions. Biliary tree, spleen, pancreas, adrenal glands, and kidneys are normal.  Proximal small bowel is dilated to the level of a transition point at the level of the umbilicus, the same spot as demonstrated on the prior study of 04/03/2012. Distal small bowel is not dilated beyond this point.  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 No free air or free fluid. No acute osseous abnormality.  IMPRESSION: Partial small bowel obstruction, probably due to an adhesion. Transition point is in the mid small bowel at the level of the umbilicus.   Electronically Signed   By: Geanie Cooley M.D.   On: 07/03/2013 21:02    Review of Systems  Constitutional: Positive for malaise/fatigue.  HENT: Negative.   Respiratory: Negative.   Cardiovascular: Negative.   Gastrointestinal: Positive for nausea and abdominal pain.  Genitourinary: Negative.   Musculoskeletal: Negative.   Skin: Negative.   All other systems reviewed and are negative.    Blood pressure 148/79, pulse 64, temperature 97.8 F (36.6 C), temperature source Oral, resp. rate 17, height 5\' 11"  (1.803 m), weight  116.3 kg (256 lb 6.3 oz), SpO2 97.00%. Physical Exam  Constitutional: He is oriented to person, place, and time. He appears well-developed and well-nourished.  HENT:  Head: Normocephalic and atraumatic.  Neck: Normal range of motion. Neck supple.  Cardiovascular: Normal rate, regular rhythm and normal heart sounds.   Respiratory: Effort normal and breath sounds normal.  GI: Soft. He exhibits distension.  There is no tenderness. There is no rebound.  Occasional bowel sounds appreciated.  Neurological: He is alert and oriented to person, place, and time.  Skin: Skin is warm and dry.     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 No need for acute surgical intervention at this time. We'll monitor his abdominal examination.  Derisha Funderburke A 07/04/2013, 8:37 AM

## 2013-07-04 NOTE — Care Management Note (Signed)
    Page 1 of 1   07/04/2013     11:26:46 AM   CARE MANAGEMENT NOTE 07/04/2013  Patient:  Bryce Cook, Bryce Cook   Account Number:  [id number]  Date Initiated:  07/04/2013  Documentation initiated by:  Rosemary Holms  Subjective/Objective Assessment:   pt lives at home alone. Sister assists as needed and he also has a girlfriend. Independent with ADL     Action/Plan:   Anticipated DC Date:     Anticipated DC Plan:  HOME/SELF CARE      DC Planning Services  CM consult      Choice offered to / List presented to:             Status of service:  Completed, signed off Medicare Important Message given?   (If response is "NO", the following Medicare IM given date fields will be blank) Date Medicare IM given:   Date Additional Medicare IM given:    Discharge Disposition:    Per UR Regulation:    If discussed at Long Length of Stay Meetings, dates discussed:    Comments:  07/04/13 Rosemary Holms RN BSN CM

## 2013-07-04 NOTE — Progress Notes (Signed)
Utilization Review Complete  

## 2013-07-05 MED ORDER — MAGNESIUM HYDROXIDE 400 MG/5ML PO SUSP
30.0000 mL | Freq: Two times a day (BID) | ORAL | Status: DC
  Administered 2013-07-05 – 2013-07-06 (×3): 30 mL via ORAL
  Filled 2013-07-05 (×3): qty 30

## 2013-07-05 NOTE — Progress Notes (Signed)
Pt ambulated in hallway four times today. Pt has ambulated approximately 250 feet each time. Pt tolerated well. No complaints at this time.

## 2013-07-05 NOTE — Progress Notes (Signed)
  Subjective: Bowel movement this morning. Is hungry. No significant abdominal pain.  Objective: Vital signs in last 24 hours: Temp:  [97.6 F (36.4 C)-98.4 F (36.9 C)] 97.6 F (36.4 C) (03/17 0524) Pulse Rate:  [86-89] 86 (03/17 0524) Resp:  [18-19] 19 (03/17 0524) BP: (159-164)/(74-82) 159/82 mmHg (03/17 0524) SpO2:  [92 %-94 %] 94 % (03/17 0734) Last BM Date: 07/03/13  Intake/Output from previous day: 03/16 0701 - 03/17 0700 In: -  Out: 1350 [Urine:850; Emesis/NG output:500] Intake/Output this shift:    General appearance: alert, cooperative and no distress Resp: clear to auscultation bilaterally Cardio: regular rate and rhythm, S1, S2 normal, no murmur, click, rub or gallop GI: soft, non-tender; bowel sounds normal; no masses,  no organomegaly  Lab Results:   Recent Labs  07/03/13 1920 07/04/13 0523  WBC 14.0* 10.6*  HGB 15.8 15.1  HCT 47.5 45.3  PLT 250 238   BMET  Recent Labs  07/03/13 1920 07/04/13 0523  NA 141 141  K 4.7 4.8  CL 98 104  CO2 28 26  GLUCOSE 141* 133*  BUN 21 21  CREATININE 1.17 0.94  CALCIUM 10.3 9.4   PT/INR No results found for this basename: LABPROT, INR,  in the last 72 hours  Studies/Results: Ct Abdomen Pelvis Wo Contrast  07/03/2013   CLINICAL DATA:  Mid abdominal pain.  EXAM: CT ABDOMEN AND PELVIS WITHOUT CONTRAST  TECHNIQUE: Multidetector CT imaging of the abdomen and pelvis was performed following the standard protocol without intravenous contrast.  COMPARISON:  CT scan dated 04/03/2012 and 12/26/2009  FINDINGS: There is a small pleural plaque laterally in the right hemi thorax seen best on image 3 of series 2, unchanged since 12/26/2009. Heart size is normal. Scattered coronary artery calcifications.  There is hepatic steatosis. No focal lesions. Biliary tree, spleen, pancreas, adrenal glands, and kidneys are normal.  Proximal small bowel is dilated to the level of a transition point at the level of the umbilicus, the same  spot as demonstrated on the prior study of 04/03/2012. Distal small bowel is not dilated beyond this point.  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 No free air or free fluid. No acute osseous abnormality.  IMPRESSION: Partial small bowel obstruction, probably due to an adhesion. Transition point is in the mid small bowel at the level of the umbilicus.   Electronically Signed   By: Geanie Cooley M.D.   On: 07/03/2013 21:02    Anti-infectives: Anti-infectives   None      Assessment/Plan: Impression: Partial small bowel obstruction, resolving. Plan: Will DC gastric tube. Will advance diet as tolerated. Anticipate discharge in next 24-48 hours.  LOS: 2 days    Dann Ventress A 07/05/2013

## 2013-07-06 NOTE — Progress Notes (Signed)
Pt discharged home today per Dr. Lovell Sheehan. Pt's IV site D/C'd and WNL. Pt provided with home medication list and discharge instructions. Verbalized understanding. Pt left floor via WC in stable condition accompanied by NT.

## 2013-07-06 NOTE — Care Management Utilization Note (Signed)
UR completed 

## 2013-07-06 NOTE — Discharge Summary (Signed)
Physician Discharge Summary  Patient ID: Bryce Cook MRN: [id number] DOB/AGE: 72-Aug-1942 72 y.o.  Admit date: 07/03/2013 Discharge date: 07/06/2013  Admission Diagnoses: Partial small bowel traction  Discharge Diagnoses: Same Active Problems:   Small bowel obstruction   Discharged Condition: good  Hospital Course: Patient is a 73 year old white male with a history of multiple abdominal surgeries who presented with a several day history of worsening abdominal pain and distention. CT scan the abdomen and pelvis revealed a possible small bowel obstruction with some inflamed bowel underneath previously placed mesh in his ventral wall. He was admitted to the hospital and an NG tube placed. Within 24 hours, he started having bowel movements and his bowel traction results. His NG tube was removed his diet was advance without difficulty. He is being discharged home in good improving condition.  Discharge Exam: Blood pressure 120/67, pulse 81, temperature 97.4 F (36.3 C), temperature source Oral, resp. rate 20, height 5\' 11"  (1.803 m), weight 116.3 kg (256 lb 6.3 oz), SpO2 96.00%. General appearance: alert, cooperative and no distress Resp: clear to auscultation bilaterally Cardio: regular rate and rhythm, S1, S2 normal, no murmur, click, rub or gallop GI: soft, non-tender; bowel sounds normal; no masses,  no organomegaly  Disposition: 01-Home or Self Care     Medication List         allopurinol 100 MG tablet  Commonly known as:  ZYLOPRIM  Take 100 mg by mouth daily.     amLODipine 5 MG tablet  Commonly known as:  NORVASC  Take 5 mg by mouth daily.     aspirin 325 MG EC tablet  Take 325 mg by mouth every other day.     cetirizine 10 MG tablet  Commonly known as:  ZYRTEC  Take 10 mg by mouth daily as needed. Allergies     CRESTOR 10 MG tablet  Generic drug:  rosuvastatin  Take 10 mg by mouth daily.     lisinopril 20 MG tablet  Commonly known as:  PRINIVIL,ZESTRIL  Take 20  mg by mouth daily.           Follow-up Information   Follow up with Dalia Heading, MD. (As needed, If symptoms worsen)    Specialty:  General Surgery   Contact information:   [address] [place] [address] [phone]       Signed: Franky Macho A 07/06/2013, 9:56 AM

## 2013-07-06 NOTE — Discharge Instructions (Signed)
Intestinal Obstruction °An intestinal obstruction is a blockage of the intestine. It can be caused by a physical blockage or by a problem of abnormal function of the intestine.  °CAUSES  °· Adhesions from previous surgeries. °· Cancer or tumor. °· A hernia, which is a condition in which a portion of the bowel bulges out through an opening or weakness in the abdomen. This sometimes squeezes the bowel. °· A swallowed object. °· Blockage (impaction) with worms is common in third world countries. °· A twisting of the bowel or telescoping of a portion of the bowel into another portion (intussusceptions). °· Anything that stops food from going through from the stomach to the anus. °SYMPTOMS  °Symptoms of bowel obstruction may include abdominal bloating, nausea, vomiting, explosive diarrhea, or explosive stool. You may not be able to hear your normal bowel sounds (such as "growling in your stomach"). You may also stop having bowel movements or passing gas. °DIAGNOSIS  °Usually this condition is diagnosed with a history and an examination. Often, lab studies (blood work) and X-rays may be used to find the cause. °TREATMENT  °The main treatment for this condition is to rest the intestine. Often, the obstruction may relieve itself and allow the intestine to start working again. Think of the intestine like a balloon that is blown up (filled with trapped food and water that has squeezed into a hole or area that it cannot get through).  °· If the obstruction is complete, a nasogastric (NG) tube is passed through the nose and into the stomach. It is then connected to suction to keep the stomach emptied out. This also helps treat the nausea and vomiting. °· If there is an imbalance in the electrolytes, they are corrected with intravenous fluids. These fluids have the proper chemicals in them to correct the problem. °· If the reason for the blockage does not get better with conservative (nonsurgical) treatment, surgery may be  necessary. Sometimes, surgery is done immediately if your surgeon knows that the problem is not going to get better with conservative treatment. °PROGNOSIS  °Depending on what the problem is, most of these problems can be treated by your caregivers with good results. Your caregiver will discuss with you the best course of action to take. °FOLLOWING SURGERY °Seek immediate medical attention if you have: °· Increasing abdominal pain, repeated vomiting, dehydration, or fainting. °· Severe weakness, chest pain, or back pain. °· Blood in your vomit or stool. °· Tarry stool. °Document Released: 06/28/2003 Document Revised: 08/02/2012 Document Reviewed: 11/26/2007 °[employer]® Patient Information ©2014 [employer]. ° °

## 2013-07-11 ENCOUNTER — Encounter (HOSPITAL_COMMUNITY): Payer: Self-pay | Admitting: Emergency Medicine

## 2013-07-11 ENCOUNTER — Emergency Department (HOSPITAL_COMMUNITY)
Admission: EM | Admit: 2013-07-11 | Discharge: 2013-07-11 | Disposition: A | Discharge status: Home / Self Care | Payer: Medicare PPO | Attending: Emergency Medicine | Admitting: Emergency Medicine

## 2013-07-11 DIAGNOSIS — M109 Gout, unspecified: Secondary | ICD-10-CM | POA: Insufficient documentation

## 2013-07-11 DIAGNOSIS — Z87891 Personal history of nicotine dependence: Secondary | ICD-10-CM | POA: Insufficient documentation

## 2013-07-11 DIAGNOSIS — Z7982 Long term (current) use of aspirin: Secondary | ICD-10-CM | POA: Insufficient documentation

## 2013-07-11 DIAGNOSIS — R5383 Other fatigue: Secondary | ICD-10-CM | POA: Insufficient documentation

## 2013-07-11 DIAGNOSIS — I10 Essential (primary) hypertension: Secondary | ICD-10-CM | POA: Insufficient documentation

## 2013-07-11 DIAGNOSIS — N390 Urinary tract infection, site not specified: Secondary | ICD-10-CM | POA: Insufficient documentation

## 2013-07-11 DIAGNOSIS — Z79899 Other long term (current) drug therapy: Secondary | ICD-10-CM | POA: Insufficient documentation

## 2013-07-11 DIAGNOSIS — E785 Hyperlipidemia, unspecified: Secondary | ICD-10-CM | POA: Insufficient documentation

## 2013-07-11 DIAGNOSIS — R5381 Other malaise: Secondary | ICD-10-CM | POA: Insufficient documentation

## 2013-07-11 DIAGNOSIS — R42 Dizziness and giddiness: Secondary | ICD-10-CM | POA: Insufficient documentation

## 2013-07-11 DIAGNOSIS — M129 Arthropathy, unspecified: Secondary | ICD-10-CM | POA: Insufficient documentation

## 2013-07-11 LAB — URINALYSIS, ROUTINE W REFLEX MICROSCOPIC
GLUCOSE, UA: NEGATIVE mg/dL
Hgb urine dipstick: NEGATIVE
Ketones, ur: NEGATIVE mg/dL
NITRITE: NEGATIVE
PH: 6 (ref 5.0–8.0)
Protein, ur: 100 mg/dL — AB
Urobilinogen, UA: 1 mg/dL (ref 0.0–1.0)

## 2013-07-11 LAB — URINE MICROSCOPIC-ADD ON

## 2013-07-11 LAB — BASIC METABOLIC PANEL
BUN: 17 mg/dL (ref 6–23)
CALCIUM: 9 mg/dL (ref 8.4–10.5)
CO2: 25 mEq/L (ref 19–32)
CREATININE: 1.22 mg/dL (ref 0.50–1.35)
Chloride: 98 mEq/L (ref 96–112)
GFR, EST AFRICAN AMERICAN: 67 mL/min — AB (ref >90)
GFR, EST NON AFRICAN AMERICAN: 58 mL/min — AB (ref >90)
Glucose, Bld: 127 mg/dL — ABNORMAL HIGH (ref 70–99)
Potassium: 4 mEq/L (ref 3.7–5.3)
SODIUM: 135 meq/L — AB (ref 137–147)

## 2013-07-11 LAB — CBC WITH DIFFERENTIAL/PLATELET
BASOS PCT: 0 % (ref 0–1)
Basophils Absolute: 0 10*3/uL (ref 0.0–0.1)
EOS ABS: 0 10*3/uL (ref 0.0–0.7)
EOS PCT: 0 % (ref 0–5)
HEMATOCRIT: 41.4 % (ref 39.0–52.0)
Hemoglobin: 13.7 g/dL (ref 13.0–17.0)
Lymphocytes Relative: 13 % (ref 12–46)
Lymphs Abs: 2.1 10*3/uL (ref 0.7–4.0)
MCH: 29.4 pg (ref 26.0–34.0)
MCHC: 33.1 g/dL (ref 30.0–36.0)
MCV: 88.8 fL (ref 78.0–100.0)
MONO ABS: 2 10*3/uL — AB (ref 0.1–1.0)
Monocytes Relative: 12 % (ref 3–12)
Neutro Abs: 12.2 10*3/uL — ABNORMAL HIGH (ref 1.7–7.7)
Neutrophils Relative %: 75 % (ref 43–77)
Platelets: 219 10*3/uL (ref 150–400)
RBC: 4.66 MIL/uL (ref 4.22–5.81)
RDW: 15.3 % (ref 11.5–15.5)
WBC: 16.4 10*3/uL — ABNORMAL HIGH (ref 4.0–10.5)

## 2013-07-11 MED ORDER — SODIUM CHLORIDE 0.9 % IV SOLN
Freq: Once | INTRAVENOUS | Status: AC
  Administered 2013-07-11: 18:00:00 via INTRAVENOUS

## 2013-07-11 MED ORDER — DEXTROSE 5 % IV SOLN
1.0000 g | Freq: Once | INTRAVENOUS | Status: AC
  Administered 2013-07-11: 1 g via INTRAVENOUS
  Filled 2013-07-11: qty 10

## 2013-07-11 MED ORDER — SODIUM CHLORIDE 0.9 % IV BOLUS (SEPSIS): 700.0000 mL | Freq: Once | INTRAVENOUS | Status: DC

## 2013-07-11 NOTE — ED Provider Notes (Signed)
CSN: [id number]     Arrival date & time 07/11/13  1547 History  This chart was scribed for Ward GivensIva L Ameer Sanden, MD by Blanchard KelchNicole Curnes, ED Scribe. The patient was seen in room APA04/APA04. Patient's care was started at 4:50 PM.     Chief Complaint  Patient presents with  . Fever      The history is provided by the patient. No language interpreter was used.    HPI Comments: Bryce Cook is a 73 y.o. male who presents to the Emergency Department complaining of constant urinary frequency that began yesterday. He reports associated chills as well as weakness and dizziness as of this morning. He states that he had left flank pain last week that resolved on its own. He denies dysuria, abdominal pain, nausea, vomiting, diarrhea, sore throat or cough. He does report chills all day yesterday. He has some weakness today.  He states that he was seen by his PCP today, Dr. Verlan FriendsHickson, in [place] and was diagnosed with prostatitis after his white count came back elevated and his UA was abnormal. He was referred here because his PCP thought he needed to be admitted. He was given a prescription for Cipro, 500 mg, twice daily. He reports a history of two prior episodes of prostatitis in December and January. He was prescribed Cipro and Keflex for these episodes, respectively and denies needed admission for either. He was last seen by a Urologist three years ago. He also reports recent abdominal surgery to correct for a bowel obstruction. He did not have a foley catheter during that admission.  A noncontrast AP CT scan during that admission, performed on 3/15 did not show evidence for renal calcifications. He is currently on medication for HTN, hyperlipidemia and gout. He denies smoking. He currently lives alone.  [place]  Past Medical History  Diagnosis Date  . Arthritis   . Hyperlipidemia   . Gout   . Essential hypertension, benign   . Cardiomyopathy     LVEF of 40-45% 2005, LVEF 60% 2013   Past  Surgical History  Procedure Laterality Date  . Ventral hernia repair    . Appendectomy    . Eye surgery     Family History  Problem Relation Age of Onset  . Heart failure Mother   . Hypertension Mother   . Hyperlipidemia Mother   . Coronary artery disease Father     Died age 72 with MI  . Hypertension Father   . Hyperlipidemia Father    History  Substance Use Topics  . Smoking status: Former Smoker    Types: Cigarettes    Quit date: 04/21/1977  . Smokeless tobacco: Not on file  . Alcohol Use: Yes     Comment: Occasionally   Lives alone  Review of Systems  Constitutional: Positive for chills. Negative for fever.  HENT: Negative for sore throat.   Respiratory: Negative for cough.   Gastrointestinal: Negative for nausea, vomiting, abdominal pain and diarrhea.  Endocrine: Positive for polyuria.  Genitourinary: Negative for dysuria.  Neurological: Positive for dizziness and weakness.  All other systems reviewed and are negative.      Allergies  Review of patient's allergies indicates no known allergies.  Home Medications   Current Outpatient Rx  Name  Route  Sig  Dispense  Refill  . acetaminophen (TYLENOL) 500 MG tablet   Oral   Take 500 mg by mouth every 6 (six) hours as needed.         .Marland Kitchen  allopurinol (ZYLOPRIM) 100 MG tablet   Oral   Take 100 mg by mouth daily.          Marland Kitchen amLODipine (NORVASC) 5 MG tablet   Oral   Take 5 mg by mouth daily.          Marland Kitchen aspirin 325 MG EC tablet   Oral   Take 325 mg by mouth every other day.         . cetirizine (ZYRTEC) 10 MG tablet   Oral   Take 10 mg by mouth daily as needed. Allergies         . CRESTOR 10 MG tablet   Oral   Take 10 mg by mouth daily.          Marland Kitchen lisinopril (PRINIVIL,ZESTRIL) 20 MG tablet   Oral   Take 20 mg by mouth daily.          Cipro 500 mg BID  Triage Vitals: BP 112/57  Pulse 88  Temp(Src) 100.2 F (37.9 C) (Oral)  Resp 18  Ht 5\' 11"  (1.803 m)  Wt 255 lb (115.667 kg)   BMI 35.58 kg/m2  SpO2 96%  Vital signs normal except low grade fever   Physical Exam  Nursing note and vitals reviewed. Constitutional: He is oriented to person, place, and time. He appears well-developed and well-nourished.  Non-toxic appearance. He does not appear ill. No distress.  HENT:  Head: Normocephalic and atraumatic.  Right Ear: External ear normal.  Left Ear: External ear normal.  Nose: Nose normal. No mucosal edema or rhinorrhea.  Mouth/Throat: Oropharynx is clear and moist and mucous membranes are normal. No dental abscesses or uvula swelling.  Eyes: Conjunctivae and EOM are normal. Pupils are equal, round, and reactive to light.  Neck: Normal range of motion and full passive range of motion without pain. Neck supple.  Cardiovascular: Normal rate, regular rhythm and normal heart sounds.  Exam reveals no gallop and no friction rub.   No murmur heard. Pulmonary/Chest: Effort normal and breath sounds normal. No respiratory distress. He has no wheezes. He has no rhonchi. He has no rales. He exhibits no tenderness and no crepitus.  Abdominal: Soft. Normal appearance and bowel sounds are normal. He exhibits no distension. There is no tenderness. There is no rebound and no guarding.  Musculoskeletal: Normal range of motion. He exhibits no edema and no tenderness.  Moves all extremities well.   Neurological: He is alert and oriented to person, place, and time. He has normal strength. No cranial nerve deficit.  Skin: Skin is warm, dry and intact. No rash noted. No erythema. No pallor.  Psychiatric: He has a normal mood and affect. His speech is normal and behavior is normal. His mood appears not anxious.    ED Course  Procedures (including critical care time) Medications  0.9 %  sodium chloride infusion ( Intravenous Stopped 07/11/13 2047)  cefTRIAXone (ROCEPHIN) 1 g in dextrose 5 % 50 mL IVPB (0 g Intravenous Stopped 07/11/13 1823)     DIAGNOSTIC STUDIES: Oxygen Saturation is  96% on room air, adequate by my interpretation.    COORDINATION OF CARE:  Review of information sent with patient from his primary care doctor shows that the office today patient had a temperature of 101.4. His urinalysis showed 35 white blood cells, 2 red blood cells and small leukocytes. Urine culture was sent. Patient had a CBC done showing white count of 17.3 thousand. Patient also had ultrasound of his abdomen done in 2009  that shows he does not have a abdominal aortic aneurysm. He had echocardiogram done 2011 showing left ventricular hypertrophy, ejection fraction 50-55%, and normal systolic function. Had ultrasound of his bladder in 2013 showing prevoid volume 210 cc, postvoid volume 4 cc. It is stated above he had a CT of his abdomen and pelvis done without contrast on 3:15 that shows he does not have any renal stones.  5:27 PM -Waiting for labs to result prior to calling hospitalist. Patient verbalizes understanding and agrees with treatment plan.  Patient ambulated by nursing staff without difficulty.  20:03 Dr Karilyn Cota, feels patient does not meet criteria for admission, even observation. States he agrees with IV antibiotics and fluids and patient can go home.   8:38 PM -Updated patient on the conversation with admitting doctor that he is not meeting criteria for admission. Will discharge. Return precautions discussed. Patient verbalizes understanding and agrees with treatment plan.     Results for orders placed during the hospital encounter of 07/11/13  URINALYSIS, ROUTINE W REFLEX MICROSCOPIC      Result Value Ref Range   Color, Urine AMBER (*) YELLOW   APPearance CLOUDY (*) CLEAR   Specific Gravity, Urine >1.030 (*) 1.005 - 1.030   pH 6.0  5.0 - 8.0   Glucose, UA NEGATIVE  NEGATIVE mg/dL   Hgb urine dipstick NEGATIVE  NEGATIVE   Bilirubin Urine SMALL (*) NEGATIVE   Ketones, ur NEGATIVE  NEGATIVE mg/dL   Protein, ur 161 (*) NEGATIVE mg/dL   Urobilinogen, UA 1.0  0.0 - 1.0  mg/dL   Nitrite NEGATIVE  NEGATIVE   Leukocytes, UA TRACE (*) NEGATIVE  URINE MICROSCOPIC-ADD ON      Result Value Ref Range   WBC, UA 11-20  <3 WBC/hpf   RBC / HPF 3-6  <3 RBC/hpf   Bacteria, UA FEW (*) RARE  CBC WITH DIFFERENTIAL      Result Value Ref Range   WBC 16.4 (*) 4.0 - 10.5 K/uL   RBC 4.66  4.22 - 5.81 MIL/uL   Hemoglobin 13.7  13.0 - 17.0 g/dL   HCT 09.6  04.5 - 40.9 %   MCV 88.8  78.0 - 100.0 fL   MCH 29.4  26.0 - 34.0 pg   MCHC 33.1  30.0 - 36.0 g/dL   RDW 81.1  91.4 - 78.2 %   Platelets 219  150 - 400 K/uL   Neutrophils Relative % 75  43 - 77 %   Neutro Abs 12.2 (*) 1.7 - 7.7 K/uL   Lymphocytes Relative 13  12 - 46 %   Lymphs Abs 2.1  0.7 - 4.0 K/uL   Monocytes Relative 12  3 - 12 %   Monocytes Absolute 2.0 (*) 0.1 - 1.0 K/uL   Eosinophils Relative 0  0 - 5 %   Eosinophils Absolute 0.0  0.0 - 0.7 K/uL   Basophils Relative 0  0 - 1 %   Basophils Absolute 0.0  0.0 - 0.1 K/uL  BASIC METABOLIC PANEL      Result Value Ref Range   Sodium 135 (*) 137 - 147 mEq/L   Potassium 4.0  3.7 - 5.3 mEq/L   Chloride 98  96 - 112 mEq/L   CO2 25  19 - 32 mEq/L   Glucose, Bld 127 (*) 70 - 99 mg/dL   BUN 17  6 - 23 mg/dL   Creatinine, Ser 9.56  0.50 - 1.35 mg/dL   Calcium 9.0  8.4 - 21.3 mg/dL  GFR calc non Af Amer 58 (*) >90 mL/min   GFR calc Af Amer 67 (*) >90 mL/min    Laboratory interpretation all normal except leukocytosis, UTI   Ct Abdomen Pelvis Wo Contrast  07/03/2013   CLINICAL DATA:  Mid abdominal pain.  EXAM: CT ABDOMEN AND PELVIS WITHOUT CONTRAST  TECHNIQUE: Multidetector CT imaging of the abdomen and pelvis was performed following the standard protocol without intravenous contrast.  COMPARISON:  CT scan dated 04/03/2012 and 12/26/2009  FINDINGS: There is a small pleural plaque laterally in the right hemi thorax seen best on image 3 of series 2, unchanged since 12/26/2009. Heart size is normal. Scattered coronary artery calcifications.  There is hepatic steatosis.  No focal lesions. Biliary tree, spleen, pancreas, adrenal glands, and kidneys are normal.  Proximal small bowel is dilated to the level of a transition point at the level of the umbilicus, the same spot as demonstrated on the prior study of 04/03/2012. Distal small bowel is not dilated beyond this point.  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 No free air or free fluid. No acute osseous abnormality.  IMPRESSION: Partial small bowel obstruction, probably due to an adhesion. Transition point is in the mid small bowel at the level of the umbilicus.   Electronically Signed   By: Geanie Cooley M.D.   On: 07/03/2013 21:02       EKG Interpretation None      MDM   Final diagnoses:  Febrile urinary tract infection   Plan discharge   Devoria Albe, MD, FACEP   I personally performed the services described in this documentation, which was scribed in my presence. The recorded information has been reviewed and considered.  Devoria Albe, MD, FACEP    Ward Givens, MD 07/12/13 [phone]

## 2013-07-11 NOTE — ED Notes (Signed)
Patient given discharge instruction, verbalized understand. IV removed, band aid applied. Patient ambulatory out of the department.  

## 2013-07-11 NOTE — ED Notes (Signed)
Pt c/o polyuria since yesterday. Pt seen by pcp today and dx with prostatitis and sent to ED for fever and elevated WBC. Pt also c/o malaise. nad noted.

## 2013-07-11 NOTE — Discharge Instructions (Signed)
Take your cipro twice a day as written by Dr Verlan Friends. Have him recheck you in the office in 2-3 days. You can take acetaminophen 650 mg every 6 hrs for fever. Drink plenty of fluids. Return to the ED if you get vomiting, feel like you are going to pass out or you feel worse.          Urinary Tract Infection Urinary tract infections (UTIs) can develop anywhere along your urinary tract. Your urinary tract is your body's drainage system for removing wastes and extra water. Your urinary tract includes two kidneys, two ureters, a bladder, and a urethra. Your kidneys are a pair of bean-shaped organs. Each kidney is about the size of your fist. They are located below your ribs, one on each side of your spine. CAUSES Infections are caused by microbes, which are microscopic organisms, including fungi, viruses, and bacteria. These organisms are so small that they can only be seen through a microscope. Bacteria are the microbes that most commonly cause UTIs. SYMPTOMS  Symptoms of UTIs may vary by age and gender of the patient and by the location of the infection. Symptoms in young women typically include a frequent and intense urge to urinate and a painful, burning feeling in the bladder or urethra during urination. Older women and men are more likely to be tired, shaky, and weak and have muscle aches and abdominal pain. A fever may mean the infection is in your kidneys. Other symptoms of a kidney infection include pain in your back or sides below the ribs, nausea, and vomiting. DIAGNOSIS To diagnose a UTI, your caregiver will ask you about your symptoms. Your caregiver also will ask to provide a urine sample. The urine sample will be tested for bacteria and white blood cells. White blood cells are made by your body to help fight infection. TREATMENT  Typically, UTIs can be treated with medication. Because most UTIs are caused by a bacterial infection, they usually can be treated with the use of antibiotics. The  choice of antibiotic and length of treatment depend on your symptoms and the type of bacteria causing your infection. HOME CARE INSTRUCTIONS  If you were prescribed antibiotics, take them exactly as your caregiver instructs you. Finish the medication even if you feel better after you have only taken some of the medication.  Drink enough water and fluids to keep your urine clear or pale yellow.  Avoid caffeine, tea, and carbonated beverages. They tend to irritate your bladder.  Empty your bladder often. Avoid holding urine for long periods of time.  Empty your bladder before and after sexual intercourse.  After a bowel movement, women should cleanse from front to back. Use each tissue only once. SEEK MEDICAL CARE IF:   You have back pain.  You develop a fever.  Your symptoms do not begin to resolve within 3 days. SEEK IMMEDIATE MEDICAL CARE IF:   You have severe back pain or lower abdominal pain.  You develop chills.  You have nausea or vomiting.  You have continued burning or discomfort with urination. MAKE SURE YOU:   Understand these instructions.  Will watch your condition.  Will get help right away if you are not doing well or get worse. Document Released: 01/15/2005 Document Revised: 10/07/2011 Document Reviewed: 05/16/2011 [facility] Patient Information 2014 [place], [place].

## 2013-07-13 LAB — URINE CULTURE
Colony Count: NO GROWTH
Culture: NO GROWTH

## 2013-10-21 ENCOUNTER — Emergency Department (HOSPITAL_COMMUNITY): Payer: Medicare PPO

## 2013-10-21 ENCOUNTER — Encounter (HOSPITAL_COMMUNITY): Payer: Self-pay | Admitting: Emergency Medicine

## 2013-10-21 ENCOUNTER — Inpatient Hospital Stay (HOSPITAL_COMMUNITY)
Admission: EM | Admit: 2013-10-21 | Discharge: 2013-10-22 | DRG: 389 | Disposition: A | Discharge status: Home / Self Care | Payer: Medicare PPO | Attending: Internal Medicine | Admitting: Internal Medicine

## 2013-10-21 ENCOUNTER — Inpatient Hospital Stay (HOSPITAL_COMMUNITY): Payer: Medicare PPO

## 2013-10-21 DIAGNOSIS — I4891 Unspecified atrial fibrillation: Secondary | ICD-10-CM | POA: Diagnosis present

## 2013-10-21 DIAGNOSIS — K529 Noninfective gastroenteritis and colitis, unspecified: Secondary | ICD-10-CM | POA: Diagnosis present

## 2013-10-21 DIAGNOSIS — K5289 Other specified noninfective gastroenteritis and colitis: Secondary | ICD-10-CM

## 2013-10-21 DIAGNOSIS — E782 Mixed hyperlipidemia: Secondary | ICD-10-CM

## 2013-10-21 DIAGNOSIS — D72829 Elevated white blood cell count, unspecified: Secondary | ICD-10-CM | POA: Diagnosis present

## 2013-10-21 DIAGNOSIS — I10 Essential (primary) hypertension: Secondary | ICD-10-CM

## 2013-10-21 DIAGNOSIS — R112 Nausea with vomiting, unspecified: Secondary | ICD-10-CM

## 2013-10-21 DIAGNOSIS — I428 Other cardiomyopathies: Secondary | ICD-10-CM | POA: Diagnosis present

## 2013-10-21 DIAGNOSIS — Z8249 Family history of ischemic heart disease and other diseases of the circulatory system: Secondary | ICD-10-CM

## 2013-10-21 DIAGNOSIS — Z87891 Personal history of nicotine dependence: Secondary | ICD-10-CM

## 2013-10-21 DIAGNOSIS — K56609 Unspecified intestinal obstruction, unspecified as to partial versus complete obstruction: Principal | ICD-10-CM

## 2013-10-21 DIAGNOSIS — M129 Arthropathy, unspecified: Secondary | ICD-10-CM | POA: Diagnosis present

## 2013-10-21 DIAGNOSIS — E785 Hyperlipidemia, unspecified: Secondary | ICD-10-CM | POA: Diagnosis present

## 2013-10-21 HISTORY — DX: Unspecified intestinal obstruction, unspecified as to partial versus complete obstruction: K56.609

## 2013-10-21 HISTORY — DX: Unspecified abdominal hernia without obstruction or gangrene: K46.9

## 2013-10-21 LAB — COMPREHENSIVE METABOLIC PANEL
ALT: 38 U/L (ref 0–53)
AST: 32 U/L (ref 0–37)
Albumin: 4.8 g/dL (ref 3.5–5.2)
Alkaline Phosphatase: 96 U/L (ref 39–117)
Anion gap: 17 — ABNORMAL HIGH (ref 5–15)
BUN: 17 mg/dL (ref 6–23)
CO2: 25 mEq/L (ref 19–32)
CREATININE: 0.98 mg/dL (ref 0.50–1.35)
Calcium: 10.5 mg/dL (ref 8.4–10.5)
Chloride: 100 mEq/L (ref 96–112)
GFR calc Af Amer: >90 mL/min (ref >90)
GFR, EST NON AFRICAN AMERICAN: 80 mL/min — AB (ref >90)
Glucose, Bld: 125 mg/dL — ABNORMAL HIGH (ref 70–99)
Potassium: 4.1 mEq/L (ref 3.7–5.3)
Sodium: 142 mEq/L (ref 137–147)
Total Bilirubin: 0.5 mg/dL (ref 0.3–1.2)
Total Protein: 9.1 g/dL — ABNORMAL HIGH (ref 6.0–8.3)

## 2013-10-21 LAB — CBC WITH DIFFERENTIAL/PLATELET
Basophils Absolute: 0.1 10*3/uL (ref 0.0–0.1)
Basophils Relative: 0 % (ref 0–1)
EOS ABS: 0.3 10*3/uL (ref 0.0–0.7)
Eosinophils Relative: 2 % (ref 0–5)
HCT: 49.9 % (ref 39.0–52.0)
HEMOGLOBIN: 17.2 g/dL — AB (ref 13.0–17.0)
Lymphocytes Relative: 17 % (ref 12–46)
Lymphs Abs: 2.7 10*3/uL (ref 0.7–4.0)
MCH: 30.6 pg (ref 26.0–34.0)
MCHC: 34.5 g/dL (ref 30.0–36.0)
MCV: 88.8 fL (ref 78.0–100.0)
MONOS PCT: 9 % (ref 3–12)
Monocytes Absolute: 1.3 10*3/uL — ABNORMAL HIGH (ref 0.1–1.0)
NEUTROS PCT: 72 % (ref 43–77)
Neutro Abs: 11.1 10*3/uL — ABNORMAL HIGH (ref 1.7–7.7)
Platelets: 241 10*3/uL (ref 150–400)
RBC: 5.62 MIL/uL (ref 4.22–5.81)
RDW: 15 % (ref 11.5–15.5)
WBC: 15.4 10*3/uL — ABNORMAL HIGH (ref 4.0–10.5)

## 2013-10-21 LAB — URINALYSIS, ROUTINE W REFLEX MICROSCOPIC
BILIRUBIN URINE: NEGATIVE
Glucose, UA: NEGATIVE mg/dL
Hgb urine dipstick: NEGATIVE
KETONES UR: NEGATIVE mg/dL
Leukocytes, UA: NEGATIVE
NITRITE: NEGATIVE
PROTEIN: 100 mg/dL — AB
Specific Gravity, Urine: 1.02 (ref 1.005–1.030)
Urobilinogen, UA: 0.2 mg/dL (ref 0.0–1.0)
pH: 6 (ref 5.0–8.0)

## 2013-10-21 LAB — LACTIC ACID, PLASMA: Lactic Acid, Venous: 2.2 mmol/L (ref 0.5–2.2)

## 2013-10-21 LAB — URINE MICROSCOPIC-ADD ON

## 2013-10-21 LAB — LIPASE, BLOOD: Lipase: 32 U/L (ref 11–59)

## 2013-10-21 MED ORDER — TAMSULOSIN HCL 0.4 MG PO CAPS: 0.4000 mg | ORAL_CAPSULE | Freq: Every evening | ORAL | Status: DC

## 2013-10-21 MED ORDER — METRONIDAZOLE IN NACL 5-0.79 MG/ML-% IV SOLN
500.0000 mg | Freq: Three times a day (TID) | INTRAVENOUS | Status: DC
  Administered 2013-10-22 (×2): 500 mg via INTRAVENOUS
  Filled 2013-10-21 (×2): qty 100

## 2013-10-21 MED ORDER — LISINOPRIL 10 MG PO TABS
20.0000 mg | ORAL_TABLET | Freq: Every day | ORAL | Status: DC
  Administered 2013-10-22: 20 mg via ORAL
  Filled 2013-10-21: qty 2

## 2013-10-21 MED ORDER — ONDANSETRON HCL 4 MG/2ML IJ SOLN: 4.0000 mg | Freq: Four times a day (QID) | INTRAMUSCULAR | Status: DC | PRN

## 2013-10-21 MED ORDER — SODIUM CHLORIDE 0.9 % IV SOLN
INTRAVENOUS | Status: DC
  Administered 2013-10-21 – 2013-10-22 (×2): via INTRAVENOUS

## 2013-10-21 MED ORDER — ACETAMINOPHEN 325 MG PO TABS
650.0000 mg | ORAL_TABLET | Freq: Four times a day (QID) | ORAL | Status: DC | PRN
Start: 2013-10-21 — End: 2013-10-22

## 2013-10-21 MED ORDER — APIXABAN 5 MG PO TABS
5.0000 mg | ORAL_TABLET | Freq: Two times a day (BID) | ORAL | Status: DC
  Administered 2013-10-22 (×2): 5 mg via ORAL
  Filled 2013-10-21 (×2): qty 1

## 2013-10-21 MED ORDER — ONDANSETRON HCL 4 MG/2ML IJ SOLN
4.0000 mg | Freq: Once | INTRAMUSCULAR | Status: DC
  Filled 2013-10-21: qty 2

## 2013-10-21 MED ORDER — IOHEXOL 300 MG/ML  SOLN
50.0000 mL | Freq: Once | INTRAMUSCULAR | Status: AC | PRN
  Administered 2013-10-21: 50 mL via ORAL

## 2013-10-21 MED ORDER — AMLODIPINE BESYLATE 5 MG PO TABS
5.0000 mg | ORAL_TABLET | Freq: Every day | ORAL | Status: DC
  Administered 2013-10-22: 5 mg via ORAL
  Filled 2013-10-21: qty 1

## 2013-10-21 MED ORDER — IOHEXOL 300 MG/ML  SOLN
100.0000 mL | Freq: Once | INTRAMUSCULAR | Status: AC | PRN
  Administered 2013-10-21: 100 mL via INTRAVENOUS

## 2013-10-21 MED ORDER — ATORVASTATIN CALCIUM 20 MG PO TABS: 20.0000 mg | ORAL_TABLET | Freq: Every day | ORAL | Status: DC

## 2013-10-21 MED ORDER — ONDANSETRON HCL 4 MG PO TABS: 4.0000 mg | ORAL_TABLET | Freq: Four times a day (QID) | ORAL | Status: DC | PRN

## 2013-10-21 MED ORDER — MORPHINE SULFATE 4 MG/ML IJ SOLN
4.0000 mg | INTRAMUSCULAR | Status: DC | PRN
  Administered 2013-10-21: 4 mg via INTRAVENOUS
  Filled 2013-10-21: qty 1

## 2013-10-21 MED ORDER — SODIUM CHLORIDE 0.9 % IV SOLN
INTRAVENOUS | Status: AC
  Administered 2013-10-22: 01:00:00 via INTRAVENOUS

## 2013-10-21 MED ORDER — METOPROLOL SUCCINATE ER 50 MG PO TB24
50.0000 mg | ORAL_TABLET | Freq: Every day | ORAL | Status: DC
  Administered 2013-10-22: 50 mg via ORAL
  Filled 2013-10-21: qty 1

## 2013-10-21 MED ORDER — ALLOPURINOL 100 MG PO TABS
100.0000 mg | ORAL_TABLET | Freq: Every day | ORAL | Status: DC
  Administered 2013-10-22: 100 mg via ORAL
  Filled 2013-10-21: qty 1

## 2013-10-21 MED ORDER — ONDANSETRON HCL 4 MG/2ML IJ SOLN: 4.0000 mg | Freq: Three times a day (TID) | INTRAMUSCULAR | Status: AC | PRN

## 2013-10-21 MED ORDER — ACETAMINOPHEN 650 MG RE SUPP: 650.0000 mg | Freq: Four times a day (QID) | RECTAL | Status: DC | PRN

## 2013-10-21 MED ORDER — CIPROFLOXACIN IN D5W 400 MG/200ML IV SOLN
400.0000 mg | Freq: Two times a day (BID) | INTRAVENOUS | Status: DC
  Administered 2013-10-22 (×2): 400 mg via INTRAVENOUS
  Filled 2013-10-21 (×2): qty 200

## 2013-10-21 MED ORDER — IOHEXOL 300 MG/ML  SOLN: 50.0000 mL | Freq: Once | INTRAMUSCULAR | Status: DC | PRN

## 2013-10-21 MED ORDER — ONDANSETRON HCL 4 MG/2ML IJ SOLN
4.0000 mg | INTRAMUSCULAR | Status: AC | PRN
  Administered 2013-10-21 (×2): 4 mg via INTRAVENOUS
  Filled 2013-10-21: qty 2

## 2013-10-21 MED ORDER — MORPHINE SULFATE 2 MG/ML IJ SOLN: 2.0000 mg | INTRAMUSCULAR | Status: DC | PRN

## 2013-10-21 NOTE — ED Provider Notes (Signed)
CSN: [id number]     Arrival date & time 10/21/13  1758 History   First MD Initiated Contact with Patient 10/21/13 1809     Chief Complaint  Patient presents with  . Abdominal Pain    HPI Pt was seen at 1815. Per pt, c/o gradual onset and persistence of multiple intermittent episodes of N/V that began at noon today.  Has been associated with generalized abd "pain." Describes the abd pain as "stabbing" and "spasms." Last BM approximately 1700 today was "normal for me" per pt. Pt states he has had similar symptoms previously and was told "I had a piece of bowel was twisted up with food blocking it." Denies diarrhea, no CP/SOB, no back pain, no fevers, no black or blood in stools or emesis.     Past Medical History  Diagnosis Date  . Arthritis   . Hyperlipidemia   . Gout   . Essential hypertension, benign   . Cardiomyopathy     LVEF of 40-45% 2005, LVEF 60% 2013  . Abdominal hernia    Past Surgical History  Procedure Laterality Date  . Ventral hernia repair    . Appendectomy    . Eye surgery     Family History  Problem Relation Age of Onset  . Heart failure Mother   . Hypertension Mother   . Hyperlipidemia Mother   . Coronary artery disease Father     Died age 20 with MI  . Hypertension Father   . Hyperlipidemia Father    History  Substance Use Topics  . Smoking status: Former Smoker    Types: Cigarettes    Quit date: 04/21/1977  . Smokeless tobacco: Not on file  . Alcohol Use: Yes     Comment: Occasionally    Review of Systems ROS: Statement: All systems negative except as marked or noted in the HPI; Constitutional: Negative for fever and chills. ; ; Eyes: Negative for eye pain, redness and discharge. ; ; ENMT: Negative for ear pain, hoarseness, nasal congestion, sinus pressure and sore throat. ; ; Cardiovascular: Negative for chest pain, palpitations, diaphoresis, dyspnea and peripheral edema. ; ; Respiratory: Negative for cough, wheezing and stridor. ; ;  Gastrointestinal: +N/V, abd pain. Negative for diarrhea, blood in stool, hematemesis, jaundice and rectal bleeding. . ; ; Genitourinary: Negative for dysuria, flank pain and hematuria. ; ; Musculoskeletal: Negative for back pain and neck pain. Negative for swelling and trauma.; ; Skin: Negative for pruritus, rash, abrasions, blisters, bruising and skin lesion.; ; Neuro: Negative for headache, lightheadedness and neck stiffness. Negative for weakness, altered level of consciousness , altered mental status, extremity weakness, paresthesias, involuntary movement, seizure and syncope.      Allergies  Review of patient's allergies indicates no known allergies.  Home Medications   Prior to Admission medications   Medication Sig Start Date End Date Taking? Authorizing Provider  acetaminophen (TYLENOL) 500 MG tablet Take 500 mg by mouth every 6 (six) hours as needed.    Historical Provider, MD  allopurinol (ZYLOPRIM) 100 MG tablet Take 100 mg by mouth daily.  11/17/11   Historical Provider, MD  amLODipine (NORVASC) 5 MG tablet Take 5 mg by mouth daily.  12/02/11   Historical Provider, MD  aspirin 325 MG EC tablet Take 325 mg by mouth every other day.    Historical Provider, MD  cetirizine (ZYRTEC) 10 MG tablet Take 10 mg by mouth daily as needed. Allergies    Historical Provider, MD  CRESTOR 10 MG tablet Take 10  mg by mouth daily.  11/24/11   Historical Provider, MD  lisinopril (PRINIVIL,ZESTRIL) 20 MG tablet Take 20 mg by mouth daily.    Historical Provider, MD   BP 151/91  Pulse 88  Temp(Src) 97.5 F (36.4 C) (Oral)  Resp 21  Ht 5\' 11"  (1.803 m)  Wt 255 lb (115.667 kg)  BMI 35.58 kg/m2  SpO2 94% Physical Exam 1820: Physical examination:  Nursing notes reviewed; Vital signs and O2 SAT reviewed;  Constitutional: Well developed, Well nourished, Well hydrated, Uncomfortable appearing.; Head:  Normocephalic, atraumatic; Eyes: EOMI, PERRL, No scleral icterus; ENMT: Mouth and pharynx normal, Mucous  membranes moist; Neck: Supple, Full range of motion, No lymphadenopathy; Cardiovascular: Regular rate and rhythm, No gallop; Respiratory: Breath sounds clear & equal bilaterally, No wheezes.  Speaking full sentences with ease, Normal respiratory effort/excursion; Chest: Nontender, Movement normal; Abdomen: +dry heaving on arrival to ED. Soft, +mild diffuse tenderness to palp, +distended. Decreased bowel sounds; Genitourinary: No CVA tenderness; Extremities: Pulses normal, No tenderness, No edema, No calf edema or asymmetry.; Neuro: AA&Ox3, Major CN grossly intact.  Speech clear. No gross focal motor or sensory deficits in extremities. Climbs on and off stretcher easily by himself. Gait steady.; Skin: Color normal, Warm, Dry.   ED Course  Procedures     MDM  MDM Reviewed: previous chart, nursing note and vitals Reviewed previous: labs Interpretation: labs, x-ray and CT scan    Results for orders placed during the hospital encounter of 10/21/13  URINALYSIS, ROUTINE W REFLEX MICROSCOPIC      Result Value Ref Range   Color, Urine YELLOW  YELLOW   APPearance CLEAR  CLEAR   Specific Gravity, Urine 1.020  1.005 - 1.030   pH 6.0  5.0 - 8.0   Glucose, UA NEGATIVE  NEGATIVE mg/dL   Hgb urine dipstick NEGATIVE  NEGATIVE   Bilirubin Urine NEGATIVE  NEGATIVE   Ketones, ur NEGATIVE  NEGATIVE mg/dL   Protein, ur 409100 (*) NEGATIVE mg/dL   Urobilinogen, UA 0.2  0.0 - 1.0 mg/dL   Nitrite NEGATIVE  NEGATIVE   Leukocytes, UA NEGATIVE  NEGATIVE  CBC WITH DIFFERENTIAL      Result Value Ref Range   WBC 15.4 (*) 4.0 - 10.5 K/uL   RBC 5.62  4.22 - 5.81 MIL/uL   Hemoglobin 17.2 (*) 13.0 - 17.0 g/dL   HCT 81.149.9  91.439.0 - 78.252.0 %   MCV 88.8  78.0 - 100.0 fL   MCH 30.6  26.0 - 34.0 pg   MCHC 34.5  30.0 - 36.0 g/dL   RDW 95.615.0  21.311.5 - 08.615.5 %   Platelets 241  150 - 400 K/uL   Neutrophils Relative % 72  43 - 77 %   Neutro Abs 11.1 (*) 1.7 - 7.7 K/uL   Lymphocytes Relative 17  12 - 46 %   Lymphs Abs 2.7  0.7 -  4.0 K/uL   Monocytes Relative 9  3 - 12 %   Monocytes Absolute 1.3 (*) 0.1 - 1.0 K/uL   Eosinophils Relative 2  0 - 5 %   Eosinophils Absolute 0.3  0.0 - 0.7 K/uL   Basophils Relative 0  0 - 1 %   Basophils Absolute 0.1  0.0 - 0.1 K/uL  COMPREHENSIVE METABOLIC PANEL      Result Value Ref Range   Sodium 142  137 - 147 mEq/L   Potassium 4.1  3.7 - 5.3 mEq/L   Chloride 100  96 - 112 mEq/L  CO2 25  19 - 32 mEq/L   Glucose, Bld 125 (*) 70 - 99 mg/dL   BUN 17  6 - 23 mg/dL   Creatinine, Ser 7.94  0.50 - 1.35 mg/dL   Calcium 80.1  8.4 - 65.5 mg/dL   Total Protein 9.1 (*) 6.0 - 8.3 g/dL   Albumin 4.8  3.5 - 5.2 g/dL   AST 32  0 - 37 U/L   ALT 38  0 - 53 U/L   Alkaline Phosphatase 96  39 - 117 U/L   Total Bilirubin 0.5  0.3 - 1.2 mg/dL   GFR calc non Af Amer 80 (*) >90 mL/min   GFR calc Af Amer >90  >90 mL/min   Anion gap 17 (*) 5 - 15  LIPASE, BLOOD      Result Value Ref Range   Lipase 32  11 - 59 U/L  LACTIC ACID, PLASMA      Result Value Ref Range   Lactic Acid, Venous 2.2  0.5 - 2.2 mmol/L  URINE MICROSCOPIC-ADD ON      Result Value Ref Range   WBC, UA 0-2  <3 WBC/hpf   Dg Chest 2 View 10/21/2013   CLINICAL DATA:  Abdominal pain, nausea/vomiting  EXAM: CHEST  2 VIEW  COMPARISON:  04/03/2012  FINDINGS: Lungs are essentially clear.  No pleural effusion or pneumothorax.  Cardiomegaly.  Degenerative changes of the visualized thoracolumbar spine.  IMPRESSION: No evidence of acute cardiopulmonary disease.   Electronically Signed   By: Charline Bills M.D.   On: 10/21/2013 20:43   Ct Abdomen Pelvis W Contrast 10/21/2013   CLINICAL DATA:  Lower abdominal pain, vomiting.  EXAM: CT ABDOMEN AND PELVIS WITH CONTRAST  TECHNIQUE: Multidetector CT imaging of the abdomen and pelvis was performed using the standard protocol following bolus administration of intravenous contrast.  CONTRAST:  44mL OMNIPAQUE IOHEXOL 300 MG/ML SOLN, OMNIPAQUE IOHEXOL 300 MG/ML SOLN  COMPARISON:  07/03/2013   FINDINGS: Heart is normal size.  Lung bases are clear.  No effusions.  Diffuse fatty infiltration of the liver with areas of focal fatty sparing adjacent to the gallbladder fossa. No focal lesions. Gallbladder, spleen, pancreas, adrenals and kidneys are unremarkable.  Sigmoid diverticulosis. No active diverticulitis. Mildly prominent and fluid filled proximal and mid small bowel loops. Distal small bowel loops are decompressed. Findings concerning for early/ low grade partial small bowel obstruction. Transition point appears to be a thick-walled loop of small bowel anteriorly on image 72 of series 2. No free air or free fluid. Urinary bladder is decompressed, grossly unremarkable. Aorta and iliac vessels are heavily calcified, non aneurysmal.  No acute bony abnormality. Degenerative changes in the lumbar spine and lower thoracic spine.  IMPRESSION: Low-grade or early partial small bowel obstruction which appears to be due to a thick walled small bowel loop in the anterior pelvis. This presumably represents an area of enteritis, either infectious or ischemic.  Diffuse fatty infiltration of the liver.   Electronically Signed   By: Charlett Nose M.D.   On: 10/21/2013 20:20    2055:  Feels better after IV zofran. Dx and testing d/w pt and family.  Questions answered.  Verb understanding, agreeable to admit.  T/C to [facility] Dr. Sharl Ma, case discussed, including:  HPI, pertinent PM/SHx, VS/PE, dx testing, ED course and treatment:  Agreeable to admit, requests to write temporary orders, obtain medical bed.     Laray Anger, DO 10/24/13 [phone]

## 2013-10-21 NOTE — ED Notes (Signed)
Gradual onset lower mid abdominal pain began at 1200 today. BM around 1700 and vomited around 1715.  Has been seen for similar complaint 4-5 months ago and was told he was pocketing food in an area of bowel where he'd had hernia repair.

## 2013-10-21 NOTE — ED Notes (Signed)
Report to Sharon, RN.

## 2013-10-21 NOTE — ED Notes (Signed)
Vomited first bottle of contrast.

## 2013-10-21 NOTE — ED Notes (Signed)
No nausea

## 2013-10-21 NOTE — H&P (Signed)
PCP:   Marjean DonnaHICKSON,WILLIAM, MD   Chief Complaint:  Abdominal pain  HPI: 73 year old male who   has a past medical history of Arthritis; Hyperlipidemia; Gout; Essential hypertension, benign; Cardiomyopathy; Abdominal hernia; and Small bowel obstruction. today presents to the ED with chief complaint of abdominal pain that started around 12:00 in the afternoon. Patient says that he developed abdominal distention and also had 2 episodes of vomiting which was associated with nausea. Patient does have a history of small bowel obstruction and was recently discharged after partial small bowel obstruction resolved in March 2015. The patient had a big bowel movement around 5 PM today. In the ED CT scan abdomen was done which showed early partial small bowel obstruction, thickwalled small bowel loop in the anterior pelvis which could represent a of enteritis. Patient also has elevated white count of 15,000. He denies pain at this time, says that he he is unable to pass gas. He denies chest pain, no headache no blurred vision, no shortness of breath. [facility] started the patient on Apixaban for  irregular heart beat as per patient  Allergies:  No Known Allergies    Past Medical History  Diagnosis Date  . Arthritis   . Hyperlipidemia   . Gout   . Essential hypertension, benign   . Cardiomyopathy     LVEF of 40-45% 2005, LVEF 60% 2013  . Abdominal hernia   . Small bowel obstruction     Past Surgical History  Procedure Laterality Date  . Ventral hernia repair    . Appendectomy    . Eye surgery      Prior to Admission medications   Medication Sig Start Date End Date Taking? Authorizing Provider  allopurinol (ZYLOPRIM) 100 MG tablet Take 100 mg by mouth daily.  11/17/11  Yes Historical Provider, MD  amLODipine (NORVASC) 5 MG tablet Take 5 mg by mouth daily.  12/02/11  Yes Historical Provider, MD  apixaban (ELIQUIS) 5 MG TABS tablet Take 5 mg by  mouth 2 (two) times daily.   Yes Historical Provider, MD  cetirizine (ZYRTEC) 10 MG tablet Take 10 mg by mouth daily.   Yes Historical Provider, MD  furosemide (LASIX) 20 MG tablet Take 20 mg by mouth daily.   Yes Historical Provider, MD  lisinopril (PRINIVIL,ZESTRIL) 20 MG tablet Take 20 mg by mouth daily.   Yes Historical Provider, MD  metoprolol succinate (TOPROL-XL) 50 MG 24 hr tablet Take 50 mg by mouth daily. Take with or immediately following a meal.   Yes Historical Provider, MD  mometasone (ASMANEX 30 METERED DOSES) 220 MCG/INH inhaler Inhale 2 puffs into the lungs daily.   Yes Historical Provider, MD  rosuvastatin (CRESTOR) 10 MG tablet Take 10 mg by mouth daily.   Yes Historical Provider, MD  tamsulosin (FLOMAX) 0.4 MG CAPS capsule Take 0.4 mg by mouth every evening.   Yes Historical Provider, MD  acetaminophen (TYLENOL) 500 MG tablet Take 500 mg by mouth every 6 (six) hours as needed. pain    Historical Provider, MD    Social History:  reports that he quit smoking about 36 years ago. His smoking use included Cigarettes. He smoked 0.00 packs per day. He does not have any smokeless tobacco history on file. He reports that he drinks alcohol. He reports that he does not use illicit drugs.  Family History  Problem Relation Age of Onset  . Heart failure Mother   . Hypertension Mother   .  Hyperlipidemia Mother   . Coronary artery disease Father     Died age 24 with MI  . Hypertension Father   . Hyperlipidemia Father      All the positives are listed in BOLD  Review of Systems:  HEENT: Headache, blurred vision, runny nose, sore throat Neck: Hypothyroidism, hyperthyroidism,,lymphadenopathy Chest : Shortness of breath, history of COPD, Asthma Heart : Chest pain, history of coronary arterey disease GI:  Nausea, vomiting, diarrhea, constipation, GERD GU: Dysuria, urgency, frequency of urination, hematuria Neuro: Stroke, seizures, syncope Psych: Depression, anxiety,  hallucinations   Physical Exam: Blood pressure 126/66, pulse 65, temperature 97.6 F (36.4 C), temperature source Oral, resp. rate 20, height 5\' 11"  (1.803 m), weight 115.667 kg (255 lb), SpO2 94.00%. Constitutional:   Patient is a well-developed and well-nourished male in no acute distress and cooperative with exam. Head: Normocephalic and atraumatic Mouth: Mucus membranes moist Eyes: PERRL, EOMI, conjunctivae normal Neck: Supple, No Thyromegaly Cardiovascular: RRR, S1 normal, S2 normal Pulmonary/Chest: CTAB, no wheezes, rales, or rhonchi Abdominal: Soft. Non-tender, distended, bowel sounds are normal, no masses, organomegaly, or guarding present.  Neurological: A&O x3, Strenght is normal and symmetric bilaterally, cranial nerve II-XII are grossly intact, no focal motor deficit, sensory intact to light touch bilaterally.  Extremities : No Cyanosis, Clubbing or Edema  Labs on Admission:  Basic Metabolic Panel:  Recent Labs Lab 10/21/13 1830  NA 142  K 4.1  CL 100  CO2 25  GLUCOSE 125*  BUN 17  CREATININE 0.98  CALCIUM 10.5   Liver Function Tests:  Recent Labs Lab 10/21/13 1830  AST 32  ALT 38  ALKPHOS 96  BILITOT 0.5  PROT 9.1*  ALBUMIN 4.8    Recent Labs Lab 10/21/13 1830  LIPASE 32   No results found for this basename: AMMONIA,  in the last 168 hours CBC:  Recent Labs Lab 10/21/13 1830  WBC 15.4*  NEUTROABS 11.1*  HGB 17.2*  HCT 49.9  MCV 88.8  PLT 241   Cardiac Enzymes: No results found for this basename: CKTOTAL, CKMB, CKMBINDEX, TROPONINI,  in the last 168 hours  BNP (last 3 results) No results found for this basename: PROBNP,  in the last 8760 hours CBG: No results found for this basename: GLUCAP,  in the last 168 hours  Radiological Exams on Admission: Dg Chest 2 View  10/21/2013   CLINICAL DATA:  Abdominal pain, nausea/vomiting  EXAM: CHEST  2 VIEW  COMPARISON:  04/03/2012  FINDINGS: Lungs are essentially clear.  No pleural effusion or  pneumothorax.  Cardiomegaly.  Degenerative changes of the visualized thoracolumbar spine.  IMPRESSION: No evidence of acute cardiopulmonary disease.   Electronically Signed   By: Charline Bills M.D.   On: 10/21/2013 20:43   Ct Abdomen Pelvis W Contrast  10/21/2013   CLINICAL DATA:  Lower abdominal pain, vomiting.  EXAM: CT ABDOMEN AND PELVIS WITH CONTRAST  TECHNIQUE: Multidetector CT imaging of the abdomen and pelvis was performed using the standard protocol following bolus administration of intravenous contrast.  CONTRAST:  50mL OMNIPAQUE IOHEXOL 300 MG/ML SOLN, OMNIPAQUE IOHEXOL 300 MG/ML SOLN  COMPARISON:  07/03/2013  FINDINGS: Heart is normal size.  Lung bases are clear.  No effusions.  Diffuse fatty infiltration of the liver with areas of focal fatty sparing adjacent to the gallbladder fossa. No focal lesions. Gallbladder, spleen, pancreas, adrenals and kidneys are unremarkable.  Sigmoid diverticulosis. No active diverticulitis. Mildly prominent and fluid filled proximal and mid small bowel loops. Distal small bowel  loops are decompressed. Findings concerning for early/ low grade partial small bowel obstruction. Transition point appears to be a thick-walled loop of small bowel anteriorly on image 72 of series 2. No free air or free fluid. Urinary bladder is decompressed, grossly unremarkable. Aorta and iliac vessels are heavily calcified, non aneurysmal.  No acute bony abnormality. Degenerative changes in the lumbar spine and lower thoracic spine.  IMPRESSION: Low-grade or early partial small bowel obstruction which appears to be due to a thick walled small bowel loop in the anterior pelvis. This presumably represents an area of enteritis, either infectious or ischemic.  Diffuse fatty infiltration of the liver.   Electronically Signed   By: Charlett Nose M.D.   On: 10/21/2013 20:20       Assessment/Plan Active Problems:   SBO (small bowel obstruction)   Enteritis  Enteritis, early partial  SBO Patient had a bowel movement before coming to the ED, at this time he is not having vomiting. Does not require NG tube . We'll start the patient on Cipro and Flagyl empirically for possible infectious process as he also has elevated white count. Will obtain surgical consultation in a.m., and also repeat abdominal x-rays 2 view in a.m.  Anticoagulation Patient is on anticoagulation with Apixaban,  due to irregular heartbeat as per patient. Will check EKG. Patient will be continued on this medication, he has a followup appointment with his cardiologist at [facility] on Monday.  Hypertension Continue metoprolol, lisinopril, amlodipine.  Code status:Patient is full code, does not want to be on life support for a long time   Family discussion: Admission, patients condition and plan of care including tests being ordered have been discussed with the patient and *his wife at bedside  who indicate understanding and agree with the plan and Code Status.   Time Spent on Admission: 60 minutes  LAMA,GAGAN S [facility] Pager: [phone] 10/21/2013, 9:47 PM  If 7PM-7AM, please contact night-coverage  [email or web address]  Password [email or web address]

## 2013-10-22 ENCOUNTER — Inpatient Hospital Stay (HOSPITAL_COMMUNITY): Payer: Medicare PPO

## 2013-10-22 DIAGNOSIS — R112 Nausea with vomiting, unspecified: Secondary | ICD-10-CM

## 2013-10-22 DIAGNOSIS — I10 Essential (primary) hypertension: Secondary | ICD-10-CM

## 2013-10-22 LAB — COMPREHENSIVE METABOLIC PANEL
ALT: 27 U/L (ref 0–53)
AST: 23 U/L (ref 0–37)
Albumin: 3.3 g/dL — ABNORMAL LOW (ref 3.5–5.2)
Alkaline Phosphatase: 68 U/L (ref 39–117)
Anion gap: 12 (ref 5–15)
BUN: 17 mg/dL (ref 6–23)
CALCIUM: 8.6 mg/dL (ref 8.4–10.5)
CO2: 23 meq/L (ref 19–32)
CREATININE: 0.91 mg/dL (ref 0.50–1.35)
Chloride: 104 mEq/L (ref 96–112)
GFR calc Af Amer: >90 mL/min (ref >90)
GFR, EST NON AFRICAN AMERICAN: 83 mL/min — AB (ref >90)
GLUCOSE: 121 mg/dL — AB (ref 70–99)
Potassium: 4.1 mEq/L (ref 3.7–5.3)
Sodium: 139 mEq/L (ref 137–147)
Total Bilirubin: 0.4 mg/dL (ref 0.3–1.2)
Total Protein: 6.7 g/dL (ref 6.0–8.3)

## 2013-10-22 LAB — CBC
HCT: 42.3 % (ref 39.0–52.0)
Hemoglobin: 14 g/dL (ref 13.0–17.0)
MCH: 29.9 pg (ref 26.0–34.0)
MCHC: 33.1 g/dL (ref 30.0–36.0)
MCV: 90.4 fL (ref 78.0–100.0)
PLATELETS: 207 10*3/uL (ref 150–400)
RBC: 4.68 MIL/uL (ref 4.22–5.81)
RDW: 15.4 % (ref 11.5–15.5)
WBC: 8.5 10*3/uL (ref 4.0–10.5)

## 2013-10-22 MED ORDER — ONDANSETRON HCL 4 MG PO TABS: 4.0000 mg | ORAL_TABLET | Freq: Four times a day (QID) | ORAL | Status: DC | PRN

## 2013-10-22 NOTE — Consult Note (Signed)
Reason for Consult:small bowel obstruction   Referring Physician: Hospitalist  Bryce Cook is an 72 y.o. male.  HPI: Patient is a 73yo wm who has had previous episodes of partial small bowel obstruction, usually resolving without surgery.  Last episode in 3/15.  Presents with recurrence.  Had one episode at home yesterday of upper abdominal pain and emesis.  Presented to ER, had another episode after drinking iv contrast.  Currently feels much better and would like to go home and advance his diet as tolerated.  Past Medical History  Diagnosis Date  . Arthritis   . Hyperlipidemia   . Gout   . Essential hypertension, benign   . Cardiomyopathy     LVEF of 40-45% 2005, LVEF 60% 2013  . Abdominal hernia   . Small bowel obstruction     Past Surgical History  Procedure Laterality Date  . Ventral hernia repair    . Appendectomy    . Eye surgery      Family History  Problem Relation Age of Onset  . Heart failure Mother   . Hypertension Mother   . Hyperlipidemia Mother   . Coronary artery disease Father     Died age 6 with MI  . Hypertension Father   . Hyperlipidemia Father     Social History:  reports that he quit smoking about 36 years ago. His smoking use included Cigarettes. He smoked 0.00 packs per day. He does not have any smokeless tobacco history on file. He reports that he drinks alcohol. He reports that he does not use illicit drugs.  Allergies: No Known Allergies  Medications: I have reviewed the patient's current medications.  Results for orders placed during the hospital encounter of 10/21/13 (from the past 48 hour(s))  CBC WITH DIFFERENTIAL     Status: Abnormal   Collection Time    10/21/13  6:30 PM      Result Value Ref Range   WBC 15.4 (*) 4.0 - 10.5 K/uL   RBC 5.62  4.22 - 5.81 MIL/uL   Hemoglobin 17.2 (*) 13.0 - 17.0 g/dL   HCT 49.9  39.0 - 52.0 %   MCV 88.8  78.0 - 100.0 fL   MCH 30.6  26.0 - 34.0 pg   MCHC 34.5  30.0 - 36.0 g/dL   RDW 15.0  11.5 -  15.5 %   Platelets 241  150 - 400 K/uL   Neutrophils Relative % 72  43 - 77 %   Neutro Abs 11.1 (*) 1.7 - 7.7 K/uL   Lymphocytes Relative 17  12 - 46 %   Lymphs Abs 2.7  0.7 - 4.0 K/uL   Monocytes Relative 9  3 - 12 %   Monocytes Absolute 1.3 (*) 0.1 - 1.0 K/uL   Eosinophils Relative 2  0 - 5 %   Eosinophils Absolute 0.3  0.0 - 0.7 K/uL   Basophils Relative 0  0 - 1 %   Basophils Absolute 0.1  0.0 - 0.1 K/uL  COMPREHENSIVE METABOLIC PANEL     Status: Abnormal   Collection Time    10/21/13  6:30 PM      Result Value Ref Range   Sodium 142  137 - 147 mEq/L   Potassium 4.1  3.7 - 5.3 mEq/L   Chloride 100  96 - 112 mEq/L   CO2 25  19 - 32 mEq/L   Glucose, Bld 125 (*) 70 - 99 mg/dL   BUN 17  6 - 23 mg/dL  Creatinine, Ser 0.98  0.50 - 1.35 mg/dL   Calcium 10.5  8.4 - 10.5 mg/dL   Total Protein 9.1 (*) 6.0 - 8.3 g/dL   Albumin 4.8  3.5 - 5.2 g/dL   AST 32  0 - 37 U/L   ALT 38  0 - 53 U/L   Alkaline Phosphatase 96  39 - 117 U/L   Total Bilirubin 0.5  0.3 - 1.2 mg/dL   GFR calc non Af Amer 80 (*) >90 mL/min   GFR calc Af Amer >90  >90 mL/min   Comment: (NOTE)     The eGFR has been calculated using the CKD EPI equation.     This calculation has not been validated in all clinical situations.     eGFR's persistently <90 mL/min signify possible Chronic Kidney     Disease.   Anion gap 17 (*) 5 - 15  LIPASE, BLOOD     Status: None   Collection Time    10/21/13  6:30 PM      Result Value Ref Range   Lipase 32  11 - 59 U/L  LACTIC ACID, PLASMA     Status: None   Collection Time    10/21/13  6:30 PM      Result Value Ref Range   Lactic Acid, Venous 2.2  0.5 - 2.2 mmol/L  URINALYSIS, ROUTINE W REFLEX MICROSCOPIC     Status: Abnormal   Collection Time    10/21/13  7:30 PM      Result Value Ref Range   Color, Urine YELLOW  YELLOW   APPearance CLEAR  CLEAR   Specific Gravity, Urine 1.020  1.005 - 1.030   pH 6.0  5.0 - 8.0   Glucose, UA NEGATIVE  NEGATIVE mg/dL   Hgb urine dipstick  NEGATIVE  NEGATIVE   Bilirubin Urine NEGATIVE  NEGATIVE   Ketones, ur NEGATIVE  NEGATIVE mg/dL   Protein, ur 100 (*) NEGATIVE mg/dL   Urobilinogen, UA 0.2  0.0 - 1.0 mg/dL   Nitrite NEGATIVE  NEGATIVE   Leukocytes, UA NEGATIVE  NEGATIVE  URINE MICROSCOPIC-ADD ON     Status: None   Collection Time    10/21/13  7:30 PM      Result Value Ref Range   WBC, UA 0-2  <3 WBC/hpf  CBC     Status: None   Collection Time    10/22/13  4:31 AM      Result Value Ref Range   WBC 8.5  4.0 - 10.5 K/uL   RBC 4.68  4.22 - 5.81 MIL/uL   Hemoglobin 14.0  13.0 - 17.0 g/dL   Comment: DELTA CHECK NOTED     RESULT REPEATED AND VERIFIED   HCT 42.3  39.0 - 52.0 %   MCV 90.4  78.0 - 100.0 fL   MCH 29.9  26.0 - 34.0 pg   MCHC 33.1  30.0 - 36.0 g/dL   RDW 15.4  11.5 - 15.5 %   Platelets 207  150 - 400 K/uL  COMPREHENSIVE METABOLIC PANEL     Status: Abnormal   Collection Time    10/22/13  4:31 AM      Result Value Ref Range   Sodium 139  137 - 147 mEq/L   Potassium 4.1  3.7 - 5.3 mEq/L   Chloride 104  96 - 112 mEq/L   CO2 23  19 - 32 mEq/L   Glucose, Bld 121 (*) 70 - 99 mg/dL   BUN 17  6 - 23 mg/dL   Creatinine, Ser 7.23  0.50 - 1.35 mg/dL   Calcium 8.6  8.4 - 57.5 mg/dL   Total Protein 6.7  6.0 - 8.3 g/dL   Albumin 3.3 (*) 3.5 - 5.2 g/dL   AST 23  0 - 37 U/L   ALT 27  0 - 53 U/L   Alkaline Phosphatase 68  39 - 117 U/L   Total Bilirubin 0.4  0.3 - 1.2 mg/dL   GFR calc non Af Amer 83 (*) >90 mL/min   GFR calc Af Amer >90  >90 mL/min   Comment: (NOTE)     The eGFR has been calculated using the CKD EPI equation.     This calculation has not been validated in all clinical situations.     eGFR's persistently <90 mL/min signify possible Chronic Kidney     Disease.   Anion gap 12  5 - 15    Dg Chest 2 View  10/21/2013   CLINICAL DATA:  Abdominal pain, nausea/vomiting  EXAM: CHEST  2 VIEW  COMPARISON:  04/03/2012  FINDINGS: Lungs are essentially clear.  No pleural effusion or pneumothorax.   Cardiomegaly.  Degenerative changes of the visualized thoracolumbar spine.  IMPRESSION: No evidence of acute cardiopulmonary disease.   Electronically Signed   By: Charline Bills M.D.   On: 10/21/2013 20:43   Dg Abd 1 View  10/22/2013   CLINICAL DATA:  Partial small bowel obstruction.  EXAM: ABDOMEN - 1 VIEW  COMPARISON:  Yesterday.  FINDINGS: Mildly dilated small bowel in the left upper abdomen. Normal caliber colon containing gas, oral contrast and stool. No gross free peritoneal air. Hernia repair mesh anchors. Lumbar and lower thoracic spine degenerative changes.  IMPRESSION: Mild proximal small bowel ileus or partial obstruction with little change.   Electronically Signed   By: Gordan Payment M.D.   On: 10/22/2013 10:14   Ct Abdomen Pelvis W Contrast  10/21/2013   CLINICAL DATA:  Lower abdominal pain, vomiting.  EXAM: CT ABDOMEN AND PELVIS WITH CONTRAST  TECHNIQUE: Multidetector CT imaging of the abdomen and pelvis was performed using the standard protocol following bolus administration of intravenous contrast.  CONTRAST:  25mL OMNIPAQUE IOHEXOL 300 MG/ML SOLN, OMNIPAQUE IOHEXOL 300 MG/ML SOLN  COMPARISON:  07/03/2013  FINDINGS: Heart is normal size.  Lung bases are clear.  No effusions.  Diffuse fatty infiltration of the liver with areas of focal fatty sparing adjacent to the gallbladder fossa. No focal lesions. Gallbladder, spleen, pancreas, adrenals and kidneys are unremarkable.  Sigmoid diverticulosis. No active diverticulitis. Mildly prominent and fluid filled proximal and mid small bowel loops. Distal small bowel loops are decompressed. Findings concerning for early/ low grade partial small bowel obstruction. Transition point appears to be a thick-walled loop of small bowel anteriorly on image 72 of series 2. No free air or free fluid. Urinary bladder is decompressed, grossly unremarkable. Aorta and iliac vessels are heavily calcified, non aneurysmal.  No acute bony abnormality. Degenerative  changes in the lumbar spine and lower thoracic spine.  IMPRESSION: Low-grade or early partial small bowel obstruction which appears to be due to a thick walled small bowel loop in the anterior pelvis. This presumably represents an area of enteritis, either infectious or ischemic.  Diffuse fatty infiltration of the liver.   Electronically Signed   By: Charlett Nose M.D.   On: 10/21/2013 20:20   Dg Abd 2 Views  10/21/2013   CLINICAL DATA:  Small bowel obstruction on CT.  Followup.  EXAM: ABDOMEN - 2 VIEW  COMPARISON:  CT 10/21/2013  FINDINGS: Gas within non disc dependent colon and mildly prominent small bowel loops. Dilated small bowel loops are not well visualized, possibly as they are fluid-filled on the prior CT. No organomegaly. Contrast material noted within the urinary bladder. Lung bases are clear.  IMPRESSION: Nonspecific bowel gas pattern by plain film. Gas within borderline size small bowel loops. Of note, the prominent small bowel loops by CT were only slightly dilated and many were fluid filled and therefore likely not well visualized by plain film.   Electronically Signed   By: Rolm Baptise M.D.   On: 10/21/2013 23:53    ROS: see chart  Blood pressure 110/70, pulse 80, temperature 97.5 F (36.4 C), temperature source Oral, resp. rate 20, height $RemoveBe'5\' 11"'[id number]$  (1.803 m), weight 115.667 kg (255 lb), SpO2 93.00%. Physical Exam: Pleasant WM in NAD Abdomen:  Rotund, but soft.  NT/ND.  No hernias noted.  Assessment/Plan: Imp:  Partial small bowel obstruction, resolving Plan:  No need for acute surgical intervention.  OK for discharge from my standpoint.  Follow up with me as needed.  Pavielle Biggar A 10/22/2013, 11:43 AM

## 2013-10-22 NOTE — Discharge Summary (Signed)
Physician Discharge Summary  Bryce Cook OVZ:[id number] DOB: 10-May-1940 DOA: 10/21/2013  PCP: Marjean Donna, MD  Admit date: 10/21/2013 Discharge date: 10/22/2013  Time spent: 45 minutes  Recommendations for Outpatient Follow-up:  Patient will be discharged home. Patient may followup with his primary care physician within one to 2 weeks of discharge. Patient may also follow up with Dr. Franky Macho, general surgeon, on an as-needed basis. Patient should continue his medications as prescribed. Patient was instructed to continue a liquid diet and to advance slowly as tolerated.  Discharge Diagnoses:  Partial small bowel obstruction/questionable enteritis Leukocytosis Atrial fibrillation Hypertension  Discharge Condition: Stable  Diet recommendation: Liquid diet, advance as tolerated  Filed Weights   10/21/13 1804 10/21/13 2252  Weight: 115.667 kg (255 lb) 115.667 kg (255 lb)    History of present illness:  73 year old male history of arthritis, hyperlipidemia, gout, hypertension, cardiomyopathy, abdominal hernia, with previous small bowel obstructions presents emergency department with complaints of abdominal pain which started around yesterday afternoon. Patient developed abdominal distention had 2 episodes of vomiting which were associated with nausea. Patient does have a history of small bowel obstruction was recently discharged after a partial small bowel obstruction in March of 2015. Patient did have a huge bowel movement around 5 PM yesterday. In the emergency department, CT scan of the abdomen was done and showed partial small bowel obstruction, thickened small bowel loop in the anterior pelvis which could represent a neuritis. Patient did have a white count of 15,000. At that admission, patient denied abdominal pain, chest pain, headache, blurry vision, shortness of breath. Patient does follow up with cardiologist in [place], who recently started him on Apixaban for an irregular  heartbeat.  Hospital Course:  Early partial small bowel obstruction, questionable enteritis -Patient has a history of small bowel obstructions. -Patient was admitted and made n.p.o. At this time he is placed on liquid diet and able to tolerate. -Enteritis less likely as patient's white count did resolve from 15 to 8.5. -Dr. Lovell Sheehan, general surgeon, was consulted and appreciated and recommended patient to start on liquid diet.  He may be discharged, patient may follow up with Dr. Lovell Sheehan as needed. -Patient initially started on Cipro and Flagyl for questionable enteritis, however at this time patient has no further symptoms of nausea and vomiting, is afebrile, no leukocytosis, therefore, he will not be discharged on antibiotics.  Atrial fibrillation -Continue Apixaban -Currently rate controlled -EKG: Atrial fibrillation, rate 71  Hypertension -Controlled, continue metoprolol lisinopril and amlodipine  Leukocytosis -Likely reactive, resolved  Procedures: None  Consultations: Dr. Lovell Sheehan, general surgery   Discharge Exam: Filed Vitals:   10/22/13 1017  BP: 110/70  Pulse: 80  Temp:   Resp:      General: Well developed, well nourished, NAD, appears stated age  HEENT: NCAT, PERRLA, EOMI, Anicteic Sclera, mucous membranes moist.  Neck: Supple, no JVD, no masses  Cardiovascular: S1 S2 auscultated, no rubs, murmurs or gallops. Irregular.  Respiratory: Clear to auscultation bilaterally with equal chest rise  Abdomen: Soft, obese, nontender, nondistended, + bowel sounds  Extremities: warm dry without cyanosis clubbing or edema  Neuro: AAOx3, cranial nerves grossly intact. Strength 5/5 in patient's upper and lower extremities bilaterally  Skin: Without rashes exudates or nodules  Psych: Normal affect and demeanor with intact judgement and insight  Discharge Instructions      Discharge Instructions   Discharge instructions    Complete by:  As directed   Patient will  be discharged home. Patient may followup with  his primary care physician within one to 2 weeks of discharge. Patient may also follow up with Dr. Franky MachoMark Jenkins, general surgeon, on an as-needed basis. Patient should continue his medications as prescribed. Patient was instructed to continue a liquid diet and to advance slowly as tolerated.            Medication List         acetaminophen 500 MG tablet  Commonly known as:  TYLENOL  Take 500 mg by mouth every 6 (six) hours as needed. pain     allopurinol 100 MG tablet  Commonly known as:  ZYLOPRIM  Take 100 mg by mouth daily.     amLODipine 5 MG tablet  Commonly known as:  NORVASC  Take 5 mg by mouth daily.     ASMANEX 30 METERED DOSES 220 MCG/INH inhaler  Generic drug:  mometasone  Inhale 2 puffs into the lungs daily.     cetirizine 10 MG tablet  Commonly known as:  ZYRTEC  Take 10 mg by mouth daily.     ELIQUIS 5 MG Tabs tablet  Generic drug:  apixaban  Take 5 mg by mouth 2 (two) times daily.     furosemide 20 MG tablet  Commonly known as:  LASIX  Take 20 mg by mouth daily.     lisinopril 20 MG tablet  Commonly known as:  PRINIVIL,ZESTRIL  Take 20 mg by mouth daily.     metoprolol succinate 50 MG 24 hr tablet  Commonly known as:  TOPROL-XL  Take 50 mg by mouth daily. Take with or immediately following a meal.     ondansetron 4 MG tablet  Commonly known as:  ZOFRAN  Take 1 tablet (4 mg total) by mouth every 6 (six) hours as needed for nausea.     rosuvastatin 10 MG tablet  Commonly known as:  CRESTOR  Take 10 mg by mouth daily.     tamsulosin 0.4 MG Caps capsule  Commonly known as:  FLOMAX  Take 0.4 mg by mouth every evening.       No Known Allergies Follow-up Information   Follow up with HICKSON,WILLIAM, MD. Schedule an appointment as soon as possible for a visit in 1 week. [facility](Hospital followup )    Specialty:  Internal Medicine   Contact information:   [address] [place]  [id number] [phone]       Schedule an appointment as soon as possible for a visit with Dalia HeadingJENKINS,MARK A, MD. (As needed)    Specialty:  General Surgery   Contact information:   [address] [place] [id number] [phone]        The results of significant diagnostics from this hospitalization (including imaging, microbiology, ancillary and laboratory) are listed below for reference.    Significant Diagnostic Studies: Dg Chest 2 View  10/21/2013   CLINICAL DATA:  Abdominal pain, nausea/vomiting  EXAM: CHEST  2 VIEW  COMPARISON:  04/03/2012  FINDINGS: Lungs are essentially clear.  No pleural effusion or pneumothorax.  Cardiomegaly.  Degenerative changes of the visualized thoracolumbar spine.  IMPRESSION: No evidence of acute cardiopulmonary disease.   Electronically Signed   By: Charline BillsSriyesh  Krishnan M.D.   On: 10/21/2013 20:43   Dg Abd 1 View  10/22/2013   CLINICAL DATA:  Partial small bowel obstruction.  EXAM: ABDOMEN - 1 VIEW  COMPARISON:  Yesterday.  FINDINGS: Mildly dilated small bowel in the left upper abdomen. Normal caliber colon containing gas, oral contrast and stool. No gross  free peritoneal air. Hernia repair mesh anchors. Lumbar and lower thoracic spine degenerative changes.  IMPRESSION: Mild proximal small bowel ileus or partial obstruction with little change.   Electronically Signed   By: Gordan Payment M.D.   On: 10/22/2013 10:14   Ct Abdomen Pelvis W Contrast  10/21/2013   CLINICAL DATA:  Lower abdominal pain, vomiting.  EXAM: CT ABDOMEN AND PELVIS WITH CONTRAST  TECHNIQUE: Multidetector CT imaging of the abdomen and pelvis was performed using the standard protocol following bolus administration of intravenous contrast.  CONTRAST:  50mL OMNIPAQUE IOHEXOL 300 MG/ML SOLN, OMNIPAQUE IOHEXOL 300 MG/ML SOLN  COMPARISON:  07/03/2013  FINDINGS: Heart is normal size.  Lung bases are clear.  No effusions.  Diffuse fatty infiltration of the liver with areas of focal fatty sparing  adjacent to the gallbladder fossa. No focal lesions. Gallbladder, spleen, pancreas, adrenals and kidneys are unremarkable.  Sigmoid diverticulosis. No active diverticulitis. Mildly prominent and fluid filled proximal and mid small bowel loops. Distal small bowel loops are decompressed. Findings concerning for early/ low grade partial small bowel obstruction. Transition point appears to be a thick-walled loop of small bowel anteriorly on image 72 of series 2. No free air or free fluid. Urinary bladder is decompressed, grossly unremarkable. Aorta and iliac vessels are heavily calcified, non aneurysmal.  No acute bony abnormality. Degenerative changes in the lumbar spine and lower thoracic spine.  IMPRESSION: Low-grade or early partial small bowel obstruction which appears to be due to a thick walled small bowel loop in the anterior pelvis. This presumably represents an area of enteritis, either infectious or ischemic.  Diffuse fatty infiltration of the liver.   Electronically Signed   By: Charlett Nose M.D.   On: 10/21/2013 20:20   Dg Abd 2 Views  10/21/2013   CLINICAL DATA:  Small bowel obstruction on CT.  Followup.  EXAM: ABDOMEN - 2 VIEW  COMPARISON:  CT 10/21/2013  FINDINGS: Gas within non disc dependent colon and mildly prominent small bowel loops. Dilated small bowel loops are not well visualized, possibly as they are fluid-filled on the prior CT. No organomegaly. Contrast material noted within the urinary bladder. Lung bases are clear.  IMPRESSION: Nonspecific bowel gas pattern by plain film. Gas within borderline size small bowel loops. Of note, the prominent small bowel loops by CT were only slightly dilated and many were fluid filled and therefore likely not well visualized by plain film.   Electronically Signed   By: Charlett Nose M.D.   On: 10/21/2013 23:53    Microbiology: No results found for this or any previous visit (from the past 240 hour(s)).   Labs: Basic Metabolic Panel:  Recent Labs Lab  10/21/13 1830 10/22/13 0431  NA 142 139  K 4.1 4.1  CL 100 104  CO2 25 23  GLUCOSE 125* 121*  BUN 17 17  CREATININE 0.98 0.91  CALCIUM 10.5 8.6   Liver Function Tests:  Recent Labs Lab 10/21/13 1830 10/22/13 0431  AST 32 23  ALT 38 27  ALKPHOS 96 68  BILITOT 0.5 0.4  PROT 9.1* 6.7  ALBUMIN 4.8 3.3*    Recent Labs Lab 10/21/13 1830  LIPASE 32   No results found for this basename: AMMONIA,  in the last 168 hours CBC:  Recent Labs Lab 10/21/13 1830 10/22/13 0431  WBC 15.4* 8.5  NEUTROABS 11.1*  --   HGB 17.2* 14.0  HCT 49.9 42.3  MCV 88.8 90.4  PLT 241 207   Cardiac  Enzymes: No results found for this basename: CKTOTAL, CKMB, CKMBINDEX, TROPONINI,  in the last 168 hours BNP: BNP (last 3 results) No results found for this basename: PROBNP,  in the last 8760 hours CBG: No results found for this basename: GLUCAP,  in the last 168 hours     Signed:  Edsel Petrin  [facility] 10/22/2013, 11:59 AM

## 2013-10-22 NOTE — Discharge Instructions (Signed)
Small Bowel Obstruction °A small bowel obstruction is a blockage (obstruction) of the small intestine (small bowel). The small bowel is a long, slender tube that connects the stomach to the colon. Its job is to absorb nutrients from the fluids and foods you consume into the bloodstream.  °CAUSES  °There are many causes of intestinal blockage. The most common ones include: °· Hernias. This is a more common cause in children than adults. °· Inflammatory bowel disease (enteritis and colitis). °· Twisting of the bowel (volvulus). °· Tumors. °· Scar tissue (adhesions) from previous surgery or radiation treatment. °· Recent surgery. This may cause an acute small bowel obstruction called an ileus. °SYMPTOMS  °· Abdominal pain. This may be dull cramps or sharp pain. It may occur in one area or may be present in the entire abdomen. Pain can range from mild to severe, depending on the degree of obstruction. °· Nausea and vomiting. Vomit may be greenish or yellow bile color. °· Distended or swollen stomach. Abdominal bloating is a common symptom. °· Constipation. °· Lack of passing gas. °· Frequent belching. °· Diarrhea. This may occur if runny stool is able to leak around the obstruction. °DIAGNOSIS  °Your caregiver can usually diagnose small bowel obstruction by taking a history, doing a physical exam, and taking X-rays. If the cause is unclear, a CT scan (computerized tomography) of your abdomen and pelvis may be needed. °TREATMENT  °Treatment of the blockage depends on the cause and how bad the problem is.  °· Sometimes, the obstruction improves with bed rest and intravenous (IV) fluids. °· Resting the bowel is very important. This means following a simple diet. Sometimes, a clear liquid diet may be required for several days. °· Sometimes, a small tube (nasogastric tube) is placed into the stomach to decompress the bowel. When the bowel is blocked, it usually swells up like a balloon filled with air and fluids.  Decompression means that the air and fluids are removed by suction through that tube. This can help with pain, discomfort, and nausea. It can also help the obstruction resolve faster. °· Surgery may be required if other treatments do not work. Bowel obstruction from a hernia may require early surgery and can be an emergency procedure. Adhesions that cause frequent or severe obstructions may also require surgery. °HOME CARE INSTRUCTIONS °If your bowel obstruction is only partial or incomplete, you may be allowed to go home. °· Get plenty of rest. °· Follow your diet as directed by your caregiver. °· Only consume clear liquids until your condition improves. °· Avoid solid foods as instructed. °SEEK IMMEDIATE MEDICAL CARE IF: °· You have increased pain or cramping. °· You vomit blood. °· You have uncontrolled vomiting or nausea. °· You cannot drink fluids due to vomiting or pain. °· You develop confusion. °· You begin feeling very dry or thirsty (dehydrated). °· You have severe bloating. °· You have chills. °· You have a fever. °· You feel extremely weak or you faint. °MAKE SURE YOU: °· Understand these instructions. °· Will watch your condition. °· Will get help right away if you are not doing well or get worse. °Document Released: 06/24/2005 Document Revised: 06/30/2011 Document Reviewed: 06/21/2010 °ExitCare® Patient Information ©2015 [employer]. This information is not intended to replace advice given to you by your health care provider. Make sure you discuss any questions you have with your health care provider. ° °

## 2013-10-22 NOTE — Progress Notes (Signed)
Patient discharged with instructions, prescription, and care notes.  Verbalized understanding via teach back.  IV was removed and the site was WNL. Patient voiced no further complaints or concerns at the time of discharge.  Appointments scheduled per instructions.  Patient left the floor via w/c with staff and family in stable condition. 

## 2013-10-23 LAB — URINE CULTURE
COLONY COUNT: NO GROWTH
CULTURE: NO GROWTH

## 2013-10-25 NOTE — Progress Notes (Signed)
UR chart review completed.  

## 2015-11-02 ENCOUNTER — Encounter (HOSPITAL_COMMUNITY): Payer: Self-pay | Admitting: *Deleted

## 2015-11-02 ENCOUNTER — Observation Stay (HOSPITAL_COMMUNITY)
Admission: EM | Admit: 2015-11-02 | Discharge: 2015-11-03 | Disposition: A | Discharge status: Home / Self Care | Payer: Medicare PPO | Attending: Internal Medicine | Admitting: Internal Medicine

## 2015-11-02 ENCOUNTER — Emergency Department (HOSPITAL_COMMUNITY): Payer: Medicare PPO

## 2015-11-02 DIAGNOSIS — I10 Essential (primary) hypertension: Secondary | ICD-10-CM | POA: Diagnosis present

## 2015-11-02 DIAGNOSIS — Z791 Long term (current) use of non-steroidal anti-inflammatories (NSAID): Secondary | ICD-10-CM | POA: Diagnosis not present

## 2015-11-02 DIAGNOSIS — I429 Cardiomyopathy, unspecified: Secondary | ICD-10-CM

## 2015-11-02 DIAGNOSIS — M1990 Unspecified osteoarthritis, unspecified site: Secondary | ICD-10-CM | POA: Insufficient documentation

## 2015-11-02 DIAGNOSIS — R1033 Periumbilical pain: Secondary | ICD-10-CM | POA: Diagnosis present

## 2015-11-02 DIAGNOSIS — E785 Hyperlipidemia, unspecified: Secondary | ICD-10-CM | POA: Diagnosis not present

## 2015-11-02 DIAGNOSIS — I4891 Unspecified atrial fibrillation: Secondary | ICD-10-CM | POA: Diagnosis present

## 2015-11-02 DIAGNOSIS — Z87891 Personal history of nicotine dependence: Secondary | ICD-10-CM | POA: Diagnosis not present

## 2015-11-02 DIAGNOSIS — T7840XA Allergy, unspecified, initial encounter: Secondary | ICD-10-CM

## 2015-11-02 DIAGNOSIS — Z79899 Other long term (current) drug therapy: Secondary | ICD-10-CM | POA: Insufficient documentation

## 2015-11-02 DIAGNOSIS — K56600 Partial intestinal obstruction, unspecified as to cause: Secondary | ICD-10-CM | POA: Diagnosis present

## 2015-11-02 DIAGNOSIS — R109 Unspecified abdominal pain: Secondary | ICD-10-CM

## 2015-11-02 HISTORY — DX: Unspecified atrial fibrillation: I48.91

## 2015-11-02 LAB — COMPREHENSIVE METABOLIC PANEL
ALK PHOS: 87 U/L (ref 38–126)
ALT: 21 U/L (ref 17–63)
ANION GAP: 7 (ref 5–15)
AST: 23 U/L (ref 15–41)
Albumin: 4.5 g/dL (ref 3.5–5.0)
BUN: 18 mg/dL (ref 6–20)
CALCIUM: 9.7 mg/dL (ref 8.9–10.3)
CO2: 26 mmol/L (ref 22–32)
Chloride: 106 mmol/L (ref 101–111)
Creatinine, Ser: 1.07 mg/dL (ref 0.61–1.24)
GFR calc Af Amer: >60 mL/min (ref >60)
GFR calc non Af Amer: >60 mL/min (ref >60)
Glucose, Bld: 141 mg/dL — ABNORMAL HIGH (ref 65–99)
Potassium: 4.1 mmol/L (ref 3.5–5.1)
SODIUM: 139 mmol/L (ref 135–145)
Total Bilirubin: 0.5 mg/dL (ref 0.3–1.2)
Total Protein: 7.6 g/dL (ref 6.5–8.1)

## 2015-11-02 LAB — URINALYSIS, ROUTINE W REFLEX MICROSCOPIC
Bilirubin Urine: NEGATIVE
GLUCOSE, UA: NEGATIVE mg/dL
HGB URINE DIPSTICK: NEGATIVE
Ketones, ur: NEGATIVE mg/dL
Nitrite: NEGATIVE
PH: 6 (ref 5.0–8.0)
PROTEIN: 30 mg/dL — AB
Specific Gravity, Urine: 1.02 (ref 1.005–1.030)

## 2015-11-02 LAB — URINE MICROSCOPIC-ADD ON

## 2015-11-02 LAB — CBC
HCT: 43.1 % (ref 39.0–52.0)
HEMOGLOBIN: 14.3 g/dL (ref 13.0–17.0)
MCH: 29.1 pg (ref 26.0–34.0)
MCHC: 33.2 g/dL (ref 30.0–36.0)
MCV: 87.8 fL (ref 78.0–100.0)
Platelets: 300 10*3/uL (ref 150–400)
RBC: 4.91 MIL/uL (ref 4.22–5.81)
RDW: 15.5 % (ref 11.5–15.5)
WBC: 13 10*3/uL — ABNORMAL HIGH (ref 4.0–10.5)

## 2015-11-02 LAB — LIPASE, BLOOD: Lipase: 21 U/L (ref 11–51)

## 2015-11-02 MED ORDER — ONDANSETRON HCL 4 MG/2ML IJ SOLN
4.0000 mg | Freq: Once | INTRAMUSCULAR | Status: AC
  Administered 2015-11-02: 4 mg via INTRAVENOUS
  Filled 2015-11-02: qty 2

## 2015-11-02 MED ORDER — SODIUM CHLORIDE 0.9 % IV BOLUS (SEPSIS)
1000.0000 mL | Freq: Once | INTRAVENOUS | Status: AC
  Administered 2015-11-02: 1000 mL via INTRAVENOUS

## 2015-11-02 MED ORDER — MORPHINE SULFATE (PF) 4 MG/ML IV SOLN
4.0000 mg | Freq: Once | INTRAVENOUS | Status: AC
  Administered 2015-11-02: 4 mg via INTRAVENOUS
  Filled 2015-11-02: qty 1

## 2015-11-02 NOTE — ED Provider Notes (Signed)
CSN: [id number]     Arrival date & time 11/02/15  1857 History  By signing my name below, I, Linna Darner, attest that this documentation has been prepared under the direction and in the presence of physician practitioner, Donnetta Hutching, MD. Electronically Signed: Linna Darner, Scribe. 11/02/2015. 9:27 PM.   Chief Complaint  Patient presents with  . Abdominal Pain    The history is provided by the patient. No language interpreter was used.     HPI Comments: Bryce Cook is a 75 y.o. male with pertinent PMHx of small bowel obstruction and abdominal hernia who presents to the Emergency Department complaining of sudden onset, constant, periumbilical abdominal pain since 10 AM this morning. Pt endorses associated nausea and passing gas as well. He states that his abdomen feels larger than usual. Pt had a periumbilical hernia repair performed 8 years ago by Dr. Lovell Sheehan; he notes h/o appendectomy as well. Pt also notes h/o bowel obstruction which he has been admitted for in the past. He denies vomiting, diarrhea, or any other associated symptoms.  PCP: Dr. Verlan Friends  Past Medical History  Diagnosis Date  . Arthritis   . Hyperlipidemia   . Gout   . Essential hypertension, benign   . Cardiomyopathy (HCC)     LVEF of 40-45% 2005, LVEF 60% 2013  . Abdominal hernia   . Small bowel obstruction (HCC)   . A-fib [facility])    Past Surgical History  Procedure Laterality Date  . Ventral hernia repair    . Appendectomy    . Eye surgery     Family History  Problem Relation Age of Onset  . Heart failure Mother   . Hypertension Mother   . Hyperlipidemia Mother   . Coronary artery disease Father     Died age 47 with MI  . Hypertension Father   . Hyperlipidemia Father    Social History  Substance Use Topics  . Smoking status: Former Smoker    Types: Cigarettes    Quit date: 04/21/1977  . Smokeless tobacco: None  . Alcohol Use: Yes     Comment: Occasionally    Review of Systems  A complete  10 system review of systems was obtained and all systems are negative except as noted in the HPI and PMH.   Allergies  Amiodarone; Lipitor; and Sotalol  Home Medications   Prior to Admission medications   Medication Sig Start Date End Date Taking? Authorizing Provider  alfuzosin (UROXATRAL) 10 MG 24 hr tablet Take 10 mg by mouth every evening.   Yes Historical Provider, MD  allopurinol (ZYLOPRIM) 100 MG tablet Take 100 mg by mouth daily.  11/17/11  Yes Historical Provider, MD  apixaban (ELIQUIS) 5 MG TABS tablet Take 5 mg by mouth 2 (two) times daily.   Yes Historical Provider, MD  carvedilol (COREG) 12.5 MG tablet Take 12.5 mg by mouth 2 (two) times daily with a meal.   Yes Historical Provider, MD  cetirizine (ZYRTEC) 10 MG tablet Take 10 mg by mouth daily as needed for allergies.    Yes Historical Provider, MD  furosemide (LASIX) 40 MG tablet Take 40 mg by mouth daily.   Yes Historical Provider, MD  ibuprofen (ADVIL,MOTRIN) 800 MG tablet Take 800 mg by mouth every 8 (eight) hours as needed for moderate pain.   Yes Historical Provider, MD  lisinopril (PRINIVIL,ZESTRIL) 20 MG tablet Take 20 mg by mouth daily.   Yes Historical Provider, MD  rosuvastatin (CRESTOR) 10 MG tablet Take 10 mg  by mouth daily.   Yes Historical Provider, MD   BP 138/78 mmHg  Pulse 76  Temp(Src) 97.8 F (36.6 C) (Oral)  Resp 17  Ht 5\' 10"  (1.778 m)  Wt 245 lb (111.131 kg)  BMI 35.15 kg/m2  SpO2 99% Physical Exam  Constitutional: He is oriented to person, place, and time. He appears well-developed and well-nourished.  HENT:  Head: Normocephalic and atraumatic.  Eyes: Conjunctivae are normal.  Neck: Neck supple.  Cardiovascular: Normal rate and regular rhythm.   Pulmonary/Chest: Effort normal and breath sounds normal.  Abdominal: Soft. Bowel sounds are normal. There is tenderness.  Protuberant abdomen. Slightly tender in the periumbilical area.  Musculoskeletal: Normal range of motion.  Neurological: He is  alert and oriented to person, place, and time.  Skin: Skin is warm and dry.  Psychiatric: He has a normal mood and affect. His behavior is normal.  Nursing note and vitals reviewed.   ED Course  Procedures (including critical care time)  DIAGNOSTIC STUDIES: Oxygen Saturation is 94% on RA, adequate by my interpretation.    COORDINATION OF CARE: 11:58 PM Discussed treatment plan with pt at bedside and pt agreed to plan.  Results for orders placed or performed during the hospital encounter of 11/02/15  Lipase, blood  Result Value Ref Range   Lipase 21 11 - 51 U/L  Comprehensive metabolic panel  Result Value Ref Range   Sodium 139 135 - 145 mmol/L   Potassium 4.1 3.5 - 5.1 mmol/L   Chloride 106 101 - 111 mmol/L   CO2 26 22 - 32 mmol/L   Glucose, Bld 141 (H) 65 - 99 mg/dL   BUN 18 6 - 20 mg/dL   Creatinine, Ser 0.96 0.61 - 1.24 mg/dL   Calcium 9.7 8.9 - 43.8 mg/dL   Total Protein 7.6 6.5 - 8.1 g/dL   Albumin 4.5 3.5 - 5.0 g/dL   AST 23 15 - 41 U/L   ALT 21 17 - 63 U/L   Alkaline Phosphatase 87 38 - 126 U/L   Total Bilirubin 0.5 0.3 - 1.2 mg/dL   GFR calc non Af Amer >60 >60 mL/min   GFR calc Af Amer >60 >60 mL/min   Anion gap 7 5 - 15  CBC  Result Value Ref Range   WBC 13.0 (H) 4.0 - 10.5 K/uL   RBC 4.91 4.22 - 5.81 MIL/uL   Hemoglobin 14.3 13.0 - 17.0 g/dL   HCT 38.1 84.0 - 37.5 %   MCV 87.8 78.0 - 100.0 fL   MCH 29.1 26.0 - 34.0 pg   MCHC 33.2 30.0 - 36.0 g/dL   RDW 43.6 06.7 - 70.3 %   Platelets 300 150 - 400 K/uL  Urinalysis, Routine w reflex microscopic  Result Value Ref Range   Color, Urine YELLOW YELLOW   APPearance CLEAR CLEAR   Specific Gravity, Urine 1.020 1.005 - 1.030   pH 6.0 5.0 - 8.0   Glucose, UA NEGATIVE NEGATIVE mg/dL   Hgb urine dipstick NEGATIVE NEGATIVE   Bilirubin Urine NEGATIVE NEGATIVE   Ketones, ur NEGATIVE NEGATIVE mg/dL   Protein, ur 30 (A) NEGATIVE mg/dL   Nitrite NEGATIVE NEGATIVE   Leukocytes, UA SMALL (A) NEGATIVE  Urine  microscopic-add on  Result Value Ref Range   Squamous Epithelial / LPF 0-5 (A) NONE SEEN   WBC, UA 6-30 0 - 5 WBC/hpf   RBC / HPF 0-5 0 - 5 RBC/hpf   Bacteria, UA MANY (A) NONE SEEN   No  results found.  MDM   Final diagnoses:  Abdominal pain, unspecified abdominal location   Patient is hemodynamically stable. White count 13.0. Concerned about a possible SBO. CT A/P pending.  Discussed with Dr. Erin Hearing  I personally performed the services described in this documentation, which was scribed in my presence. The recorded information has been reviewed and is accurate.    Donnetta Hutching, MD 11/03/15 [phone]

## 2015-11-02 NOTE — ED Notes (Signed)
Pt reports abdominal pain since around 10:30 this morning. Pt also reports nausea. Pt has a known hernia.

## 2015-11-03 ENCOUNTER — Encounter (HOSPITAL_COMMUNITY): Payer: Self-pay | Admitting: General Practice

## 2015-11-03 DIAGNOSIS — I429 Cardiomyopathy, unspecified: Secondary | ICD-10-CM | POA: Diagnosis not present

## 2015-11-03 DIAGNOSIS — R109 Unspecified abdominal pain: Secondary | ICD-10-CM | POA: Diagnosis not present

## 2015-11-03 DIAGNOSIS — I4891 Unspecified atrial fibrillation: Secondary | ICD-10-CM | POA: Diagnosis not present

## 2015-11-03 DIAGNOSIS — K5669 Other intestinal obstruction: Secondary | ICD-10-CM | POA: Diagnosis not present

## 2015-11-03 DIAGNOSIS — K56600 Partial intestinal obstruction, unspecified as to cause: Secondary | ICD-10-CM | POA: Diagnosis present

## 2015-11-03 DIAGNOSIS — I10 Essential (primary) hypertension: Secondary | ICD-10-CM

## 2015-11-03 LAB — BASIC METABOLIC PANEL
ANION GAP: 6 (ref 5–15)
BUN: 18 mg/dL (ref 6–20)
CALCIUM: 8.7 mg/dL — AB (ref 8.9–10.3)
CO2: 24 mmol/L (ref 22–32)
Chloride: 108 mmol/L (ref 101–111)
Creatinine, Ser: 0.95 mg/dL (ref 0.61–1.24)
Glucose, Bld: 120 mg/dL — ABNORMAL HIGH (ref 65–99)
Potassium: 3.9 mmol/L (ref 3.5–5.1)
Sodium: 138 mmol/L (ref 135–145)

## 2015-11-03 LAB — CBC
HCT: 39 % (ref 39.0–52.0)
HEMOGLOBIN: 12.6 g/dL — AB (ref 13.0–17.0)
MCH: 28.8 pg (ref 26.0–34.0)
MCHC: 32.3 g/dL (ref 30.0–36.0)
MCV: 89 fL (ref 78.0–100.0)
Platelets: 266 10*3/uL (ref 150–400)
RBC: 4.38 MIL/uL (ref 4.22–5.81)
RDW: 15.8 % — ABNORMAL HIGH (ref 11.5–15.5)
WBC: 9 10*3/uL (ref 4.0–10.5)

## 2015-11-03 LAB — TSH: TSH: 1.262 u[IU]/mL (ref 0.350–4.500)

## 2015-11-03 LAB — LACTIC ACID, PLASMA
Lactic Acid, Venous: 1.6 mmol/L (ref 0.5–1.9)
Lactic Acid, Venous: 1.5 mmol/L (ref 0.5–1.9)

## 2015-11-03 LAB — MAGNESIUM: MAGNESIUM: 1.7 mg/dL (ref 1.7–2.4)

## 2015-11-03 MED ORDER — ONDANSETRON HCL 4 MG/2ML IJ SOLN: 4.0000 mg | Freq: Four times a day (QID) | INTRAMUSCULAR | Status: DC | PRN

## 2015-11-03 MED ORDER — CARVEDILOL 12.5 MG PO TABS
12.5000 mg | ORAL_TABLET | Freq: Two times a day (BID) | ORAL | Status: DC
  Administered 2015-11-03 (×2): 12.5 mg via ORAL
  Filled 2015-11-03 (×2): qty 1

## 2015-11-03 MED ORDER — MORPHINE SULFATE (PF) 2 MG/ML IV SOLN: 2.0000 mg | INTRAVENOUS | Status: DC | PRN

## 2015-11-03 MED ORDER — APIXABAN 5 MG PO TABS
5.0000 mg | ORAL_TABLET | Freq: Two times a day (BID) | ORAL | Status: DC
  Administered 2015-11-03 (×2): 5 mg via ORAL
  Filled 2015-11-03 (×2): qty 1

## 2015-11-03 MED ORDER — ONDANSETRON HCL 4 MG PO TABS: 4.0000 mg | ORAL_TABLET | Freq: Four times a day (QID) | ORAL | Status: DC | PRN

## 2015-11-03 MED ORDER — LISINOPRIL 10 MG PO TABS
20.0000 mg | ORAL_TABLET | Freq: Every day | ORAL | Status: DC
  Administered 2015-11-03: 20 mg via ORAL
  Filled 2015-11-03: qty 2

## 2015-11-03 MED ORDER — PNEUMOCOCCAL VAC POLYVALENT 25 MCG/0.5ML IJ INJ: 0.5000 mL | INJECTION | INTRAMUSCULAR | Status: DC

## 2015-11-03 MED ORDER — SODIUM CHLORIDE 0.9 % IV SOLN
INTRAVENOUS | Status: DC
  Administered 2015-11-03: 1000 mL via INTRAVENOUS
  Administered 2015-11-03: 03:00:00 via INTRAVENOUS

## 2015-11-03 NOTE — H&P (Signed)
History and Physical    Bryce Cook TGY:[id number] DOB: 10/12/1940 DOA: 11/02/2015  PCP: Marjean Donna, MD  Patient coming from: home  Chief Complaint: abdominal pain  HPI: Bryce Cook is a 75 y.o. male with medical history significant of umbilical hernia repair, psbo, afib, HTN, CM comes in with periumbilical abdominal pain started around 230pm yesterday and progressively got worse.  He was nauseated but did not vomit.  No BM today but is passing flatus.  His abdominal pain is much better now.  He was bloated earlier too and this as well has already improved.  Denies any fevers.  No urinary symptoms.  This has happened before to him and he sees dr Lovell Sheehan for this.  Pt referred for admission for psbo.   Review of Systems: As per HPI otherwise 10 point review of systems negative.   Past Medical History  Diagnosis Date  . Arthritis   . Hyperlipidemia   . Gout   . Essential hypertension, benign   . Cardiomyopathy (HCC)     LVEF of 40-45% 2005, LVEF 60% 2013  . Abdominal hernia   . Small bowel obstruction (HCC)   . A-fib [facility])     Past Surgical History  Procedure Laterality Date  . Ventral hernia repair    . Appendectomy    . Eye surgery       reports that he quit smoking about 38 years ago. His smoking use included Cigarettes. He does not have any smokeless tobacco history on file. He reports that he drinks alcohol. He reports that he does not use illicit drugs.  Allergies  Allergen Reactions  . Amiodarone Other (See Comments)    blister  . Lipitor [Atorvastatin] Other (See Comments)    "body tightness"  "couldn't get out of the bed"  . Sotalol Rash    Family History  Problem Relation Age of Onset  . Heart failure Mother   . Hypertension Mother   . Hyperlipidemia Mother   . Coronary artery disease Father     Died age 20 with MI  . Hypertension Father   . Hyperlipidemia Father     Prior to Admission medications   Medication Sig Start Date End Date Taking?  Authorizing Provider  alfuzosin (UROXATRAL) 10 MG 24 hr tablet Take 10 mg by mouth every evening.   Yes Historical Provider, MD  allopurinol (ZYLOPRIM) 100 MG tablet Take 100 mg by mouth daily.  11/17/11  Yes Historical Provider, MD  apixaban (ELIQUIS) 5 MG TABS tablet Take 5 mg by mouth 2 (two) times daily.   Yes Historical Provider, MD  carvedilol (COREG) 12.5 MG tablet Take 12.5 mg by mouth 2 (two) times daily with a meal.   Yes Historical Provider, MD  cetirizine (ZYRTEC) 10 MG tablet Take 10 mg by mouth daily as needed for allergies.    Yes Historical Provider, MD  furosemide (LASIX) 40 MG tablet Take 40 mg by mouth daily.   Yes Historical Provider, MD  ibuprofen (ADVIL,MOTRIN) 800 MG tablet Take 800 mg by mouth every 8 (eight) hours as needed for moderate pain.   Yes Historical Provider, MD  lisinopril (PRINIVIL,ZESTRIL) 20 MG tablet Take 20 mg by mouth daily.   Yes Historical Provider, MD  rosuvastatin (CRESTOR) 10 MG tablet Take 10 mg by mouth daily.   Yes Historical Provider, MD    Physical Exam: Filed Vitals:   11/02/15 1909 11/02/15 2137  BP: 140/67 138/78  Pulse: 106 76  Temp: 97.8 F (36.6 C)  TempSrc: Oral   Resp: 20 17  Height: 5\' 10"  (1.778 m)   Weight: 111.131 kg (245 lb)   SpO2: 94% 99%      Constitutional: NAD, calm, comfortable Filed Vitals:   11/02/15 1909 11/02/15 2137  BP: 140/67 138/78  Pulse: 106 76  Temp: 97.8 F (36.6 C)   TempSrc: Oral   Resp: 20 17  Height: 5\' 10"  (1.778 m)   Weight: 111.131 kg (245 lb)   SpO2: 94% 99%   Eyes: PERRL, lids and conjunctivae normal ENMT: Mucous membranes are moist. Posterior pharynx clear of any exudate or lesions.Normal dentition.  Neck: normal, supple, no masses, no thyromegaly Respiratory: clear to auscultation bilaterally, no wheezing, no crackles. Normal respiratory effort. No accessory muscle use.  Cardiovascular: Regular rate and rhythm, no murmurs / rubs / gallops. No extremity edema. 2+ pedal pulses. No  carotid bruits.  Abdomen: no tenderness, no masses palpated. No hepatosplenomegaly. Bowel sounds positive.  Musculoskeletal: no clubbing / cyanosis. No joint deformity upper and lower extremities. Good ROM, no contractures. Normal muscle tone.  Skin: no rashes, lesions, ulcers. No induration Neurologic: CN 2-12 grossly intact. Sensation intact, DTR normal. Strength 5/5 in all 4.  Psychiatric: Normal judgment and insight. Alert and oriented x 3. Normal mood.    Labs on Admission: I have personally reviewed following labs and imaging studies  CBC:  Recent Labs Lab 11/02/15 1920  WBC 13.0*  HGB 14.3  HCT 43.1  MCV 87.8  PLT 300   Basic Metabolic Panel:  Recent Labs Lab 11/02/15 1920  NA 139  K 4.1  CL 106  CO2 26  GLUCOSE 141*  BUN 18  CREATININE 1.07  CALCIUM 9.7   GFR: Estimated Creatinine Clearance: 75.6 mL/min (by C-G formula based on Cr of 1.07). Liver Function Tests:  Recent Labs Lab 11/02/15 1920  AST 23  ALT 21  ALKPHOS 87  BILITOT 0.5  PROT 7.6  ALBUMIN 4.5    Recent Labs Lab 11/02/15 1920  LIPASE 21   Urine analysis:    Component Value Date/Time   COLORURINE YELLOW 11/02/2015 2040   APPEARANCEUR CLEAR 11/02/2015 2040   LABSPEC 1.020 11/02/2015 2040   PHURINE 6.0 11/02/2015 2040   GLUCOSEU NEGATIVE 11/02/2015 2040   HGBUR NEGATIVE 11/02/2015 2040   BILIRUBINUR NEGATIVE 11/02/2015 2040   KETONESUR NEGATIVE 11/02/2015 2040   PROTEINUR 30* 11/02/2015 2040   UROBILINOGEN 0.2 10/21/2013 1930   NITRITE NEGATIVE 11/02/2015 2040   LEUKOCYTESUR SMALL* 11/02/2015 2040   Radiological Exams on Admission: Ct Abdomen Pelvis Wo Contrast  11/03/2015  CLINICAL DATA:  Sudden onset periumbilical abdominal pain since 10 a.m. this morning. EXAM: CT ABDOMEN AND PELVIS WITHOUT CONTRAST TECHNIQUE: Multidetector CT imaging of the abdomen and pelvis was performed following the standard protocol without IV contrast. COMPARISON:  10/21/2013. FINDINGS: Lower chest:  Heart is mildly enlarged. Coronary artery calcification is noted. Hepatobiliary: No focal abnormality in the liver on this study without intravenous contrast. No evidence of hepatomegaly. There is no evidence for gallstones, gallbladder wall thickening, or pericholecystic fluid. No intrahepatic or extrahepatic biliary dilation. Pancreas: No focal mass lesion. No dilatation of the main duct. No intraparenchymal cyst. No peripancreatic edema. Spleen: No splenomegaly. No focal mass lesion. Adrenals/Urinary Tract: No adrenal nodule or mass. Kidneys are unremarkable. No evidence for hydroureter. The urinary bladder appears normal for the degree of distention. Stomach/Bowel: Stomach is distended. 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 Small bowel loops in the left and mid abdomen are distended measuring up to  a maximum about 3.7 cm. Scattered areas of interloop mesenteric fluid are seen along the dilated loops. There is some mesenteric edema in associated as well. Nonvisualization of the appendix is consistent with the reported history of appendectomy. The terminal ileum is normal. No gross colonic mass. No colonic wall thickening. Diverticular changes noted left colon without diverticulitis. Vascular/Lymphatic: There is abdominal aortic atherosclerosis without aneurysm. There is no gastrohepatic or hepatoduodenal ligament lymphadenopathy. No intraperitoneal or retroperitoneal lymphadenopathy. No pelvic sidewall lymphadenopathy. Reproductive: The prostate gland and seminal vesicles have normal imaging features. Other: No intraperitoneal free fluid. Musculoskeletal: Bone windows reveal no worrisome lytic or sclerotic osseous lesions. Evidence of prior ventral mesh placement noted. Tiny recurrent hernia just caudal to the umbilicus contains only fat. IMPRESSION: 1. Mildly distended proximal and mid small bowel loops are associated with mesenteric edema and scattered areas of interloop mesenteric fluid. No associated small bowel  wall thickening is appreciated and no discrete transition zone can be identified. Nevertheless, imaging features are suspicious for a component of early or partial small bowel obstruction. Overall imaging features on today's study are very similar to the appearance on the exam from almost exactly 2 years ago. 2. Status post ventral hernia repair. There is a small focus of apparent recurrent hernia just caudal to the umbilicus, containing only fat. No bowel extends into this tiny hernia. Electronically Signed   By: Kennith Center M.D.   On: 11/03/2015 00:06    Assessment/Plan 75 yo male with psbo  Principal Problem:   Partial small bowel obstruction (HCC)- already improved.  Abdomen soft now, pain resolved.  Will keep npo for now and consider advancing his diet in the next 10 hours or so.  ivf.  Active Problems:   Cardiomyopathy, secondary (HCC)- noted, stop ivf when advance diet   Essential hypertension, benign- noted   Abdominal pain- due to above   A-fib [facility])- noted   Admit to medical.      DVT prophylaxis: scds.  On eloquis Code Status:  Full code.  DAVID,RACHAL A MD [facility]  If 7PM-7AM, please contact night-coverage [email or web address] Password [email or web address]  11/03/2015, 1:00 AM

## 2015-11-03 NOTE — ED Provider Notes (Signed)
4:46 AM Assumed care from Dr. Adriana Simas, please see their note for full history, physical and decision making until this point. In brief this is a 75 y.o. year old male who presented to the ED tonight with Abdominal Pain     Patient with a day of severe nausea, daughter distention and abdominal pain. CT scan shows possible obstruction. On reevaluation of patient's abdomen, it is distended and tympanic to percussion. Otherwise is stable. Discussed with Hospitalist and was admitted for overnight observation.  Labs, studies and imaging reviewed by myself and considered in medical decision making if ordered. Imaging interpreted by radiology.  Labs Reviewed  COMPREHENSIVE METABOLIC PANEL - Abnormal; Notable for the following:    Glucose, Bld 141 (*)    All other components within normal limits  CBC - Abnormal; Notable for the following:    WBC 13.0 (*)    All other components within normal limits  URINALYSIS, ROUTINE W REFLEX MICROSCOPIC (NOT AT [facility]) - Abnormal; Notable for the following:    Protein, ur 30 (*)    Leukocytes, UA SMALL (*)    All other components within normal limits  URINE MICROSCOPIC-ADD ON - Abnormal; Notable for the following:    Squamous Epithelial / LPF 0-5 (*)    Bacteria, UA MANY (*)    All other components within normal limits  LIPASE, BLOOD  LACTIC ACID, PLASMA  LACTIC ACID, PLASMA  TSH  MAGNESIUM  BASIC METABOLIC PANEL  CBC    CT ABDOMEN PELVIS WO CONTRAST  Final Result      No Follow-up on file.   Marily Memos, MD 11/03/15 [phone]

## 2015-11-03 NOTE — Progress Notes (Signed)
Pt admitted from ED in observation for rule out Small Bowel Obstruction. Pt oriented to room environment with call bell given.  Safety plan established.  Pt admission assessment completed.  Pt in no acute distress and currently denies pain only complaint abdominal tenderness.  Vital Signs stable.  Pt currently NPO, oral care offered.  Personal belongings to bedside including cell phone and wallet personal belongings policy reviewed.  Will continue to monitor.

## 2015-11-03 NOTE — ED Notes (Signed)
Report called to floor, all questions anwered

## 2015-11-03 NOTE — Care Management Obs Status (Signed)
MEDICARE OBSERVATION STATUS NOTIFICATION   Patient Details  Name: Bryce Cook MRN: [id number] Date of Birth: 24-May-1940   Medicare Observation Status Notification Given:  Yes    Fuller Plan, RN 11/03/2015, 12:00 PM

## 2015-11-03 NOTE — Discharge Summary (Signed)
Physician Discharge Summary  Bryce Cook:[id number] DOB: 1941/01/03 DOA: 11/02/2015  PCP: Bryce Donna, MD  Admit date: 11/02/2015 Discharge date: 11/03/2015  Time spent: 45 minutes  Recommendations for Outpatient Follow-up:  -Will be discharged home today. -Advised to follow-up with primary care provider in 2 weeks.   Discharge Diagnoses:  Principal Problem:   Partial small bowel obstruction (HCC) Active Problems:   Cardiomyopathy, secondary (HCC)   Essential hypertension, benign   Abdominal pain   A-fib [facility])   Discharge Condition: Stable and improved  Filed Weights   11/02/15 1909 11/03/15 0211  Weight: 111.131 kg (245 lb) 112.356 kg (247 lb 11.2 oz)    History of present illness:  As per Dr. Onalee Hua on 7/15: Bryce Cook is a 75 y.o. male with medical history significant of umbilical hernia repair, psbo, afib, HTN, CM comes in with periumbilical abdominal pain started around 230pm yesterday and progressively got worse. He was nauseated but did not vomit. No BM today but is passing flatus. His abdominal pain is much better now. He was bloated earlier too and this as well has already improved. Denies any fevers. No urinary symptoms. This has happened before to him and he sees dr Bryce Cook for this. Pt referred for admission for psbo.  Hospital Course:   Partial small bowel obstruction -Rapidly improved, diet is advanced without issues and without abdominal pain.  Rest of chronic conditions have been stable.  Procedures:  None   Consultations:  None  Discharge Instructions  Discharge Instructions    Diet - low sodium heart healthy    Complete by:  As directed      Increase activity slowly    Complete by:  As directed             Medication List    TAKE these medications        alfuzosin 10 MG 24 hr tablet  Commonly known as:  UROXATRAL  Take 10 mg by mouth every evening.     allopurinol 100 MG tablet  Commonly known as:  ZYLOPRIM    Take 100 mg by mouth daily.     carvedilol 12.5 MG tablet  Commonly known as:  COREG  Take 12.5 mg by mouth 2 (two) times daily with a meal.     cetirizine 10 MG tablet  Commonly known as:  ZYRTEC  Take 10 mg by mouth daily as needed for allergies.     ELIQUIS 5 MG Tabs tablet  Generic drug:  apixaban  Take 5 mg by mouth 2 (two) times daily.     furosemide 40 MG tablet  Commonly known as:  LASIX  Take 40 mg by mouth daily.     ibuprofen 800 MG tablet  Commonly known as:  ADVIL,MOTRIN  Take 800 mg by mouth every 8 (eight) hours as needed for moderate pain.     lisinopril 20 MG tablet  Commonly known as:  PRINIVIL,ZESTRIL  Take 20 mg by mouth daily.     rosuvastatin 10 MG tablet  Commonly known as:  CRESTOR  Take 10 mg by mouth daily.       Allergies  Allergen Reactions  . Amiodarone Other (See Comments)    blister  . Lipitor [Atorvastatin] Other (See Comments)    "body tightness"  "couldn't get out of the bed"  . Sotalol Rash       Follow-up Information    Follow up with Bryce Donna, MD. Schedule an appointment as soon as possible for  a visit in 2 weeks.   Specialty:  Internal Medicine   Contact information:   [address] [place] [address] [phone]        The results of significant diagnostics from this hospitalization (including imaging, microbiology, ancillary and laboratory) are listed below for reference.    Significant Diagnostic Studies: Ct Abdomen Pelvis Wo Contrast  11/03/2015  CLINICAL DATA:  Sudden onset periumbilical abdominal pain since 10 a.m. this morning. EXAM: CT ABDOMEN AND PELVIS WITHOUT CONTRAST TECHNIQUE: Multidetector CT imaging of the abdomen and pelvis was performed following the standard protocol without IV contrast. COMPARISON:  10/21/2013. FINDINGS: Lower chest: Heart is mildly enlarged. Coronary artery calcification is noted. Hepatobiliary: No focal abnormality in the liver on this study without intravenous  contrast. No evidence of hepatomegaly. There is no evidence for gallstones, gallbladder wall thickening, or pericholecystic fluid. No intrahepatic or extrahepatic biliary dilation. Pancreas: No focal mass lesion. No dilatation of the main duct. No intraparenchymal cyst. No peripancreatic edema. Spleen: No splenomegaly. No focal mass lesion. Adrenals/Urinary Tract: No adrenal nodule or mass. Kidneys are unremarkable. No evidence for hydroureter. The urinary bladder appears normal for the degree of distention. Stomach/Bowel: Stomach is distended. 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 Small bowel loops in the left and mid abdomen are distended measuring up to a maximum about 3.7 cm. Scattered areas of interloop mesenteric fluid are seen along the dilated loops. There is some mesenteric edema in associated as well. Nonvisualization of the appendix is consistent with the reported history of appendectomy. The terminal ileum is normal. No gross colonic mass. No colonic wall thickening. Diverticular changes noted left colon without diverticulitis. Vascular/Lymphatic: There is abdominal aortic atherosclerosis without aneurysm. There is no gastrohepatic or hepatoduodenal ligament lymphadenopathy. No intraperitoneal or retroperitoneal lymphadenopathy. No pelvic sidewall lymphadenopathy. Reproductive: The prostate gland and seminal vesicles have normal imaging features. Other: No intraperitoneal free fluid. Musculoskeletal: Bone windows reveal no worrisome lytic or sclerotic osseous lesions. Evidence of prior ventral mesh placement noted. Tiny recurrent hernia just caudal to the umbilicus contains only fat. IMPRESSION: 1. Mildly distended proximal and mid small bowel loops are associated with mesenteric edema and scattered areas of interloop mesenteric fluid. No associated small bowel wall thickening is appreciated and no discrete transition zone can be identified. Nevertheless, imaging features are suspicious for a component of  early or partial small bowel obstruction. Overall imaging features on today's study are very similar to the appearance on the exam from almost exactly 2 years ago. 2. Status post ventral hernia repair. There is a small focus of apparent recurrent hernia just caudal to the umbilicus, containing only fat. No bowel extends into this tiny hernia. Electronically Signed   By: Kennith Center M.D.   On: 11/03/2015 00:06    Microbiology: No results found for this or any previous visit (from the past 240 hour(s)).   Labs: Basic Metabolic Panel:  Recent Labs Lab 11/02/15 1920 11/03/15 0447  NA 139 138  K 4.1 3.9  CL 106 108  CO2 26 24  GLUCOSE 141* 120*  BUN 18 18  CREATININE 1.07 0.95  CALCIUM 9.7 8.7*  MG  --  1.7   Liver Function Tests:  Recent Labs Lab 11/02/15 1920  AST 23  ALT 21  ALKPHOS 87  BILITOT 0.5  PROT 7.6  ALBUMIN 4.5    Recent Labs Lab 11/02/15 1920  LIPASE 21   No results for input(s): AMMONIA in the last 168 hours. CBC:  Recent Labs Lab 11/02/15  1920 11/03/15 0447  WBC 13.0* 9.0  HGB 14.3 12.6*  HCT 43.1 39.0  MCV 87.8 89.0  PLT 300 266   Cardiac Enzymes: No results for input(s): CKTOTAL, CKMB, CKMBINDEX, TROPONINI in the last 168 hours. BNP: BNP (last 3 results) No results for input(s): BNP in the last 8760 hours.  ProBNP (last 3 results) No results for input(s): PROBNP in the last 8760 hours.  CBG: No results for input(s): GLUCAP in the last 168 hours.     SignedChaya Jan  [facility] Pager: [phone] 11/03/2015, 4:45 PM

## 2018-04-01 ENCOUNTER — Emergency Department (HOSPITAL_COMMUNITY): Payer: Medicare PPO

## 2018-04-01 ENCOUNTER — Inpatient Hospital Stay (HOSPITAL_COMMUNITY): Payer: Medicare PPO

## 2018-04-01 ENCOUNTER — Inpatient Hospital Stay (HOSPITAL_COMMUNITY)
Admission: EM | Admit: 2018-04-01 | Discharge: 2018-04-06 | DRG: 389 | Disposition: A | Discharge status: Home / Self Care | Payer: Medicare PPO | Attending: Internal Medicine | Admitting: Internal Medicine

## 2018-04-01 ENCOUNTER — Encounter (HOSPITAL_COMMUNITY): Payer: Self-pay

## 2018-04-01 ENCOUNTER — Other Ambulatory Visit: Payer: Self-pay

## 2018-04-01 DIAGNOSIS — M1990 Unspecified osteoarthritis, unspecified site: Secondary | ICD-10-CM | POA: Diagnosis present

## 2018-04-01 DIAGNOSIS — M109 Gout, unspecified: Secondary | ICD-10-CM | POA: Diagnosis present

## 2018-04-01 DIAGNOSIS — K5651 Intestinal adhesions [bands], with partial obstruction: Secondary | ICD-10-CM

## 2018-04-01 DIAGNOSIS — I5022 Chronic systolic (congestive) heart failure: Secondary | ICD-10-CM

## 2018-04-01 DIAGNOSIS — I110 Hypertensive heart disease with heart failure: Secondary | ICD-10-CM | POA: Diagnosis present

## 2018-04-01 DIAGNOSIS — Z7901 Long term (current) use of anticoagulants: Secondary | ICD-10-CM

## 2018-04-01 DIAGNOSIS — Z8349 Family history of other endocrine, nutritional and metabolic diseases: Secondary | ICD-10-CM | POA: Diagnosis not present

## 2018-04-01 DIAGNOSIS — Z791 Long term (current) use of non-steroidal anti-inflammatories (NSAID): Secondary | ICD-10-CM

## 2018-04-01 DIAGNOSIS — Z79899 Other long term (current) drug therapy: Secondary | ICD-10-CM | POA: Diagnosis not present

## 2018-04-01 DIAGNOSIS — Z87891 Personal history of nicotine dependence: Secondary | ICD-10-CM

## 2018-04-01 DIAGNOSIS — Z6835 Body mass index (BMI) 35.0-35.9, adult: Secondary | ICD-10-CM | POA: Diagnosis not present

## 2018-04-01 DIAGNOSIS — I482 Chronic atrial fibrillation, unspecified: Secondary | ICD-10-CM | POA: Diagnosis not present

## 2018-04-01 DIAGNOSIS — I10 Essential (primary) hypertension: Secondary | ICD-10-CM

## 2018-04-01 DIAGNOSIS — Z888 Allergy status to other drugs, medicaments and biological substances status: Secondary | ICD-10-CM

## 2018-04-01 DIAGNOSIS — I480 Paroxysmal atrial fibrillation: Secondary | ICD-10-CM | POA: Diagnosis present

## 2018-04-01 DIAGNOSIS — E782 Mixed hyperlipidemia: Secondary | ICD-10-CM | POA: Diagnosis present

## 2018-04-01 DIAGNOSIS — I4891 Unspecified atrial fibrillation: Secondary | ICD-10-CM | POA: Diagnosis present

## 2018-04-01 DIAGNOSIS — E669 Obesity, unspecified: Secondary | ICD-10-CM

## 2018-04-01 DIAGNOSIS — I429 Cardiomyopathy, unspecified: Secondary | ICD-10-CM | POA: Diagnosis present

## 2018-04-01 DIAGNOSIS — E661 Drug-induced obesity: Secondary | ICD-10-CM | POA: Diagnosis not present

## 2018-04-01 DIAGNOSIS — Z8739 Personal history of other diseases of the musculoskeletal system and connective tissue: Secondary | ICD-10-CM

## 2018-04-01 DIAGNOSIS — K56609 Unspecified intestinal obstruction, unspecified as to partial versus complete obstruction: Secondary | ICD-10-CM | POA: Diagnosis present

## 2018-04-01 DIAGNOSIS — I4811 Longstanding persistent atrial fibrillation: Secondary | ICD-10-CM | POA: Diagnosis not present

## 2018-04-01 DIAGNOSIS — R05 Cough: Secondary | ICD-10-CM

## 2018-04-01 DIAGNOSIS — R059 Cough, unspecified: Secondary | ICD-10-CM

## 2018-04-01 DIAGNOSIS — Z4659 Encounter for fitting and adjustment of other gastrointestinal appliance and device: Secondary | ICD-10-CM

## 2018-04-01 DIAGNOSIS — E785 Hyperlipidemia, unspecified: Secondary | ICD-10-CM | POA: Diagnosis not present

## 2018-04-01 DIAGNOSIS — Z8249 Family history of ischemic heart disease and other diseases of the circulatory system: Secondary | ICD-10-CM

## 2018-04-01 LAB — COMPREHENSIVE METABOLIC PANEL
ALT: 15 U/L (ref 0–44)
ANION GAP: 8 (ref 5–15)
AST: 20 U/L (ref 15–41)
Albumin: 4.1 g/dL (ref 3.5–5.0)
Alkaline Phosphatase: 80 U/L (ref 38–126)
BILIRUBIN TOTAL: 0.8 mg/dL (ref 0.3–1.2)
BUN: 20 mg/dL (ref 8–23)
CO2: 27 mmol/L (ref 22–32)
Calcium: 9.3 mg/dL (ref 8.9–10.3)
Chloride: 100 mmol/L (ref 98–111)
Creatinine, Ser: 1.02 mg/dL (ref 0.61–1.24)
GFR calc Af Amer: >60 mL/min (ref >60)
GFR calc non Af Amer: >60 mL/min (ref >60)
Glucose, Bld: 127 mg/dL — ABNORMAL HIGH (ref 70–99)
Potassium: 4.2 mmol/L (ref 3.5–5.1)
Sodium: 135 mmol/L (ref 135–145)
Total Protein: 8.1 g/dL (ref 6.5–8.1)

## 2018-04-01 LAB — CBC WITH DIFFERENTIAL/PLATELET
Abs Immature Granulocytes: 0.04 10*3/uL (ref 0.00–0.07)
BASOS ABS: 0.1 10*3/uL (ref 0.0–0.1)
Basophils Relative: 0 %
EOS ABS: 0.2 10*3/uL (ref 0.0–0.5)
Eosinophils Relative: 1 %
HEMATOCRIT: 43.6 % (ref 39.0–52.0)
Hemoglobin: 13.7 g/dL (ref 13.0–17.0)
IMMATURE GRANULOCYTES: 0 %
LYMPHS ABS: 1.5 10*3/uL (ref 0.7–4.0)
Lymphocytes Relative: 12 %
MCH: 29.1 pg (ref 26.0–34.0)
MCHC: 31.4 g/dL (ref 30.0–36.0)
MCV: 92.8 fL (ref 80.0–100.0)
MONOS PCT: 10 %
Monocytes Absolute: 1.3 10*3/uL — ABNORMAL HIGH (ref 0.1–1.0)
Neutro Abs: 9.6 10*3/uL — ABNORMAL HIGH (ref 1.7–7.7)
Neutrophils Relative %: 77 %
PLATELETS: 301 10*3/uL (ref 150–400)
RBC: 4.7 MIL/uL (ref 4.22–5.81)
RDW: 15 % (ref 11.5–15.5)
WBC: 12.6 10*3/uL — AB (ref 4.0–10.5)
nRBC: 0 % (ref 0.0–0.2)

## 2018-04-01 LAB — URINALYSIS, ROUTINE W REFLEX MICROSCOPIC
BILIRUBIN URINE: NEGATIVE
Glucose, UA: NEGATIVE mg/dL
HGB URINE DIPSTICK: NEGATIVE
KETONES UR: NEGATIVE mg/dL
Leukocytes, UA: NEGATIVE
NITRITE: NEGATIVE
PROTEIN: NEGATIVE mg/dL
Specific Gravity, Urine: 1.014 (ref 1.005–1.030)
pH: 7 (ref 5.0–8.0)

## 2018-04-01 LAB — LIPASE, BLOOD: LIPASE: 29 U/L (ref 11–51)

## 2018-04-01 LAB — MAGNESIUM: Magnesium: 2 mg/dL (ref 1.7–2.4)

## 2018-04-01 LAB — LACTIC ACID, PLASMA: Lactic Acid, Venous: 1.2 mmol/L (ref 0.5–1.9)

## 2018-04-01 MED ORDER — HEPARIN SODIUM (PORCINE) 5000 UNIT/ML IJ SOLN
5000.0000 [IU] | Freq: Three times a day (TID) | INTRAMUSCULAR | Status: DC
  Administered 2018-04-01 – 2018-04-05 (×14): 5000 [IU] via SUBCUTANEOUS
  Filled 2018-04-01 (×14): qty 1

## 2018-04-01 MED ORDER — ONDANSETRON HCL 4 MG/2ML IJ SOLN
4.0000 mg | Freq: Once | INTRAMUSCULAR | Status: AC
  Administered 2018-04-01: 4 mg via INTRAVENOUS
  Filled 2018-04-01: qty 2

## 2018-04-01 MED ORDER — PANTOPRAZOLE SODIUM 40 MG IV SOLR
40.0000 mg | INTRAVENOUS | Status: DC
  Administered 2018-04-01 – 2018-04-05 (×5): 40 mg via INTRAVENOUS
  Filled 2018-04-01 (×5): qty 40

## 2018-04-01 MED ORDER — SODIUM CHLORIDE 0.9 % IV SOLN
INTRAVENOUS | Status: DC
  Administered 2018-04-01 – 2018-04-03 (×3): via INTRAVENOUS

## 2018-04-01 MED ORDER — HYDRALAZINE HCL 20 MG/ML IJ SOLN: 10.0000 mg | Freq: Three times a day (TID) | INTRAMUSCULAR | Status: DC | PRN

## 2018-04-01 MED ORDER — IOPAMIDOL (ISOVUE-300) INJECTION 61%
100.0000 mL | Freq: Once | INTRAVENOUS | Status: AC | PRN
Start: 2018-04-01 — End: 2018-04-01
  Administered 2018-04-01: 100 mL via INTRAVENOUS

## 2018-04-01 MED ORDER — HYDROMORPHONE HCL 1 MG/ML IJ SOLN
1.0000 mg | Freq: Once | INTRAMUSCULAR | Status: AC
  Administered 2018-04-01: 1 mg via INTRAVENOUS
  Filled 2018-04-01: qty 1

## 2018-04-01 MED ORDER — METOPROLOL TARTRATE 5 MG/5ML IV SOLN
2.5000 mg | Freq: Three times a day (TID) | INTRAVENOUS | Status: DC
  Administered 2018-04-01 – 2018-04-05 (×13): 2.5 mg via INTRAVENOUS
  Filled 2018-04-01 (×13): qty 5

## 2018-04-01 MED ORDER — ONDANSETRON HCL 4 MG/2ML IJ SOLN: 4.0000 mg | Freq: Four times a day (QID) | INTRAMUSCULAR | Status: DC | PRN

## 2018-04-01 NOTE — ED Notes (Signed)
Dr. Henreitta Leber requested largest NGT pt could tolerate to be placed.   Ulanda Edison, RN placed 18g without difficulty but pt says he couldn't tolerate after tube was secured.  Called for chest x ray to confirm placement but pt could not tolerate and requested the tube to be removed.  Spoke to [employer], Consulting civil engineer on 300 and was told they will try to place a 53f NGT when pt gets to the floor.

## 2018-04-01 NOTE — ED Triage Notes (Signed)
Pt c/o lower abd pain and bloating since 6pm last night.  Reports has been constipated lately.  LBM was early this morning but was very small.  Denies n/v.

## 2018-04-01 NOTE — ED Provider Notes (Signed)
[facility] EMERGENCY DEPARTMENT Provider Note   CSN: [id number] Arrival date & time: 04/01/18  0720     History   Chief Complaint No chief complaint on file.   HPI Bryce Cook is a 77 y.o. male.  Pt started with abd pain and nausea since yesterday   Abdominal Pain   This is a new problem. The current episode started 6 to 12 hours ago. The problem occurs constantly. The problem has not changed since onset.The pain is associated with an unknown factor. The pain is located in the generalized abdominal region. The quality of the pain is aching. The pain is at a severity of 4/10. The pain is moderate. Associated symptoms include anorexia. Pertinent negatives include diarrhea, frequency, hematuria and headaches. Nothing aggravates the symptoms. Nothing relieves the symptoms.    Past Medical History:  Diagnosis Date  . A-fib (HCC)   . Abdominal hernia   . Arthritis   . Cardiomyopathy (HCC)    LVEF of 40-45% 2005, LVEF 60% 2013  . Essential hypertension, benign   . Gout   . Hyperlipidemia   . Small bowel obstruction [facility])     Patient Active Problem List   Diagnosis Date Noted  . Abdominal pain 11/03/2015  . Partial small bowel obstruction (HCC) 11/03/2015  . A-fib (HCC)   . AP (abdominal pain)   . Atrial fibrillation (HCC)   . SBO (small bowel obstruction) (HCC) 10/21/2013  . Enteritis 10/21/2013  . Small bowel obstruction 07/03/2013  . Cardiomyopathy, secondary (HCC) 01/01/2012  . Essential hypertension, benign 01/01/2012  . Mixed hyperlipidemia 01/01/2012    Past Surgical History:  Procedure Laterality Date  . APPENDECTOMY    . EYE SURGERY    . VENTRAL HERNIA REPAIR          Home Medications    Prior to Admission medications   Medication Sig Start Date End Date Taking? Authorizing Provider  allopurinol (ZYLOPRIM) 100 MG tablet Take 100 mg by mouth daily.  11/17/11  Yes [provider]  carvedilol (COREG) 12.5 MG tablet Take 12.5 mg by mouth 2  (two) times daily with a meal.   Yes [provider]  furosemide (LASIX) 40 MG tablet Take 40 mg by mouth daily.   Yes [provider]  lisinopril (PRINIVIL,ZESTRIL) 20 MG tablet Take 20 mg by mouth daily.   Yes [provider]  rivaroxaban (XARELTO) 20 MG TABS tablet Take 1 tablet by mouth at bedtime. 09/29/16  Yes [provider]  rosuvastatin (CRESTOR) 10 MG tablet Take 10 mg by mouth daily.   Yes [provider]  alfuzosin (UROXATRAL) 10 MG 24 hr tablet Take 10 mg by mouth every evening.    [provider]  cetirizine (ZYRTEC) 10 MG tablet Take 10 mg by mouth daily as needed for allergies.     [provider]  ibuprofen (ADVIL,MOTRIN) 800 MG tablet Take 800 mg by mouth every 8 (eight) hours as needed for moderate pain.    [provider]    Family History Family History  Problem Relation Age of Onset  . Heart failure Mother   . Hypertension Mother   . Hyperlipidemia Mother   . Coronary artery disease Father        Died age 70 with MI  . Hypertension Father   . Hyperlipidemia Father     Social History Social History   Tobacco Use  . Smoking status: Former Smoker    Types: Cigarettes    Last attempt  to quit: 04/21/1977    Years since quitting: 40.9  . Smokeless tobacco: Never Used  Substance Use Topics  . Alcohol use: Yes    Comment: Occasionally  . Drug use: No     Allergies   Amiodarone; Lipitor [atorvastatin]; and Sotalol   Review of Systems Review of Systems  Constitutional: Negative for appetite change and fatigue.  HENT: Negative for congestion, ear discharge and sinus pressure.   Eyes: Negative for discharge.  Respiratory: Negative for cough.   Cardiovascular: Negative for chest pain.  Gastrointestinal: Positive for abdominal pain and anorexia. Negative for diarrhea.  Genitourinary: Negative for frequency and hematuria.  Musculoskeletal: Negative for back pain.  Skin: Negative for rash.    Neurological: Negative for seizures and headaches.  Psychiatric/Behavioral: Negative for hallucinations.     Physical Exam Updated Vital Signs BP (!) 144/88 (BP Location: Right Arm)   Pulse (!) 51   Temp 97.7 F (36.5 C) (Oral)   Resp 18   Ht 5\' 10"  (1.778 m)   Wt 112 kg   SpO2 97%   BMI 35.43 kg/m   Physical Exam Constitutional:      Appearance: He is well-developed.  HENT:     Head: Normocephalic.     Nose: Nose normal.  Eyes:     General: No scleral icterus.    Conjunctiva/sclera: Conjunctivae normal.  Neck:     Musculoskeletal: Neck supple.     Thyroid: No thyromegaly.  Cardiovascular:     Rate and Rhythm: Normal rate and regular rhythm.     Heart sounds: No murmur. No friction rub. No gallop.   Pulmonary:     Breath sounds: No stridor. No wheezing or rales.  Chest:     Chest wall: No tenderness.  Abdominal:     General: There is no distension.     Tenderness: There is no abdominal tenderness. There is no rebound.  Musculoskeletal: Normal range of motion.  Lymphadenopathy:     Cervical: No cervical adenopathy.  Skin:    Findings: No erythema or rash.  Neurological:     Mental Status: He is oriented to person, place, and time.     Motor: No abnormal muscle tone.     Coordination: Coordination normal.  Psychiatric:        Behavior: Behavior normal.      ED Treatments / Results  Labs (all labs ordered are listed, but only abnormal results are displayed) Labs Reviewed  CBC WITH DIFFERENTIAL/PLATELET - Abnormal; Notable for the following components:      Result Value   WBC 12.6 (*)    Neutro Abs 9.6 (*)    Monocytes Absolute 1.3 (*)    All other components within normal limits  COMPREHENSIVE METABOLIC PANEL - Abnormal; Notable for the following components:   Glucose, Bld 127 (*)    All other components within normal limits  URINALYSIS, ROUTINE W REFLEX MICROSCOPIC  LIPASE, BLOOD  LACTIC ACID, PLASMA  MAGNESIUM    EKG None  Radiology Ct  Abdomen Pelvis W Contrast  Result Date: 04/01/2018 CLINICAL DATA:  Abdominal pain and constipation EXAM: CT ABDOMEN AND PELVIS WITH CONTRAST TECHNIQUE: Multidetector CT imaging of the abdomen and pelvis was performed using the standard protocol following bolus administration of intravenous contrast. CONTRAST:  ISOVUE-300 IOPAMIDOL (ISOVUE-300) INJECTION 61% COMPARISON:  November 02, 2015 FINDINGS: Lower chest: There is bibasilar atelectatic change. There are foci of coronary artery calcification. Hepatobiliary: There is hepatic steatosis. No focal liver lesions are evident. Gallbladder wall  is not appreciably thickened. There is no biliary duct dilatation. Pancreas: No pancreatic mass or inflammatory focus. Spleen: No splenic lesions are evident. Adrenals/Urinary Tract: Adrenals bilaterally appear normal. There is no evident renal mass or hydronephrosis on either side. There is moderate renal sinus fat bilaterally, a finding of questionable clinical significance. There is no appreciable renal or ureteral calculus on either side. Urinary bladder is midline with wall thickness within normal limits. Stomach/Bowel: There are multiple descending colonic and sigmoid diverticula without diverticulitis. There is mild gastric distention with fluid throughout the stomach. There are loops of dilated jejunum and proximal ileum. There is a relative transition zone in the proximal ileal region, felt to be indicative of a degree bowel obstruction. Most small bowel loops contain fluid, although there are scattered foci of air within small bowel loops. There is slight fluid adjacent to bowel in the mid abdomen just to the left of midline, likely due to inflammation secondary to developing bowel obstruction. No free air or portal venous air is demonstrable on this study. Mild mesenteric thickening is noted in the mid abdomen as well, likely due to developing bowel obstruction. There is no intramural air. Vascular/Lymphatic:  There is aortic and iliac artery atherosclerosis. No aneurysm evident. There is calcification in the proximal major mesenteric arteries. No major mesenteric arterial obstruction is demonstrated on this study. There is no evident adenopathy in the abdomen or pelvis. Reproductive: There are prostatic calculi. Prostate and seminal vesicles are normal in size and contour. No pelvic mass evident. Other: Appendix absent. No periappendiceal region inflammation. There is no abscess in the abdomen or pelvis. There is no free-flowing ascites in the abdomen or pelvis. There is a small ventral hernia containing only fat. There is mesh along the anterior abdominal wall. Musculoskeletal: There are foci of degenerative change in the lower thoracic and lumbar spine regions. Patient is status post total hip replacement on the left. No blastic or lytic bone lesions. There is no intramuscular lesion. IMPRESSION: 1. There is felt to be a degree of small bowel obstruction with transition zone at the proximal ileal level. There is mild mid abdominal loculated fluid mesenteric thickening, felt to be changes secondary to developing small bowel obstruction. No free air evident. 2. No abscess in the abdomen or pelvis. Appendix absent. No periappendiceal region inflammation. 3. Extensive left-sided diverticulosis without diverticulitis. No colonic obstruction evident. 4. 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 No major vessel occlusion. Note that there is no portal venous air or intramural air involving bowel to suggest bowel ischemia. There also foci of coronary artery calcification noted. 5. Postoperative change anterior abdominal wall. Small ventral hernia currently containing only fat. 6.  Status post left total hip replacement. Aortic Atherosclerosis (ICD10-I70.0). Electronically Signed   By: Bretta Bang III M.D.   On: 04/01/2018 09:35    Procedures Procedures (including critical care  time)  Medications Ordered in ED Medications  0.9 %  sodium chloride infusion ( Intravenous New Bag/Given 04/01/18 1135)  heparin injection 5,000 Units (has no administration in time range)  pantoprazole (PROTONIX) injection 40 mg (40 mg Intravenous Given 04/01/18 1135)  ondansetron (ZOFRAN) injection 4 mg (has no administration in time range)  metoprolol tartrate (LOPRESSOR) injection 2.5 mg (has no administration in time range)  ondansetron (ZOFRAN) injection 4 mg (4 mg Intravenous Given 04/01/18 0838)  iopamidol (ISOVUE-300) 61 % injection 100 mL (100 mLs Intravenous Contrast Given 04/01/18 0902)  HYDROmorphone (DILAUDID) injection 1 mg (1 mg Intravenous Given 04/01/18 1103)  ondansetron (ZOFRAN) injection 4 mg (4 mg Intravenous Given 04/01/18 1103)     Initial Impression / Assessment and Plan / ED Course  I have reviewed the triage vital signs and the nursing notes.  Pertinent labs & imaging results that were available during my care of the patient were reviewed by me and considered in my medical decision making (see chart for details).     Admit for sbo,  Surgery consulting  Final Clinical Impressions(s) / ED Diagnoses   Final diagnoses:  Small bowel obstruction [facility])    ED Discharge Orders    None       Bethann Berkshire, MD 04/01/18 1149

## 2018-04-01 NOTE — H&P (Signed)
History and Physical    SURAJ JOURNIGAN FGB:[id number] DOB: 03/03/1941 DOA: 04/01/2018  Referring MD/NP/PA: Estell Harpin  PCP: Zachery Dauer, MD  Patient coming from: Home  Chief Complaint: Abdominal pain and nausea  HPI: Bryce Cook is a 77 y.o. male with a past medical history significant for atrial fibrillation (chronically on Xarelto), systolic heart failure (last ejection fraction up from 45% to 60%); hypertension, hyperlipidemia, gout and prior history of small bowel obstruction; who presented to the hospital secondary to abdominal pain and nausea.  Patient reported having abdominal pain bloating sensation status approximately 12 hours prior to his arrival to the ED.  Patient expressed that his last bowel movement was earlier this morning (small and very hard; no blood, no mucus).  Patient having any vomiting, but express associated nausea with ongoing abdominal pain. Pain is generalized, constant, aching in nature, 9 out of 10 in intensity, improved with pain medications in the ED, otherwise no significant alleviating factors.  Patient reports no fever, no chills, positive decreased appetite.   Patient also denies chest pain, shortness of breath, melena, hematochezia, dysuria, hematuria, headache, blurred vision, focal weakness.  In the ED WBCs mildly elevated, hemodynamically stable and with imaging studies demonstrating SBO with concern for transition point.  General surgery has been consulted and [facility] has been called to admit patient for further evaluation and management.  Of note, patient has had at least 2 prior episode of SBO in the past that has not required any surgical intervention; last one was in 2015.  Past Medical/Surgical History: Past Medical History:  Diagnosis Date  . A-fib (HCC)   . Abdominal hernia   . Arthritis   . Cardiomyopathy (HCC)    LVEF of 40-45% 2005, LVEF 60% 2013  . Essential hypertension, benign   . Gout   . Hyperlipidemia   . Small bowel obstruction [facility])      Past Surgical History:  Procedure Laterality Date  . APPENDECTOMY    . EYE SURGERY    . VENTRAL HERNIA REPAIR      Social History:  reports that he quit smoking about 40 years ago. His smoking use included cigarettes. He has never used smokeless tobacco. He reports current alcohol use. He reports that he does not use drugs.  Allergies: Allergies  Allergen Reactions  . Amiodarone Other (See Comments)    blister  . Lipitor [Atorvastatin] Other (See Comments)    "body tightness"  "couldn't get out of the bed"  . Sotalol Rash    Family History:  Family History  Problem Relation Age of Onset  . Heart failure Mother   . Hypertension Mother   . Hyperlipidemia Mother   . Coronary artery disease Father        Died age 2 with MI  . Hypertension Father   . Hyperlipidemia Father     Prior to Admission medications   Medication Sig Start Date End Date Taking? Authorizing Provider  allopurinol (ZYLOPRIM) 100 MG tablet Take 100 mg by mouth daily.  11/17/11  Yes [provider]  carvedilol (COREG) 12.5 MG tablet Take 12.5 mg by mouth 2 (two) times daily with a meal.   Yes [provider]  furosemide (LASIX) 40 MG tablet Take 40 mg by mouth daily.   Yes [provider]  lisinopril (PRINIVIL,ZESTRIL) 20 MG tablet Take 20 mg by mouth daily.   Yes [provider]  rivaroxaban (XARELTO) 20 MG TABS tablet Take 1 tablet by mouth at bedtime. 09/29/16  Yes [provider]  rosuvastatin (CRESTOR) 10 MG tablet Take 10 mg by mouth daily.   Yes [provider]  alfuzosin (UROXATRAL) 10 MG 24 hr tablet Take 10 mg by mouth every evening.    [provider]  cetirizine (ZYRTEC) 10 MG tablet Take 10 mg by mouth daily as needed for allergies.     [provider]  ibuprofen (ADVIL,MOTRIN) 800 MG tablet Take 800 mg by mouth every 8 (eight) hours as needed for moderate pain.    [provider]    Review of Systems:    Negative except as otherwise mentioned in HPI.  Physical Exam: Vitals:   04/01/18 0748 04/01/18 0750 04/01/18 1106  BP:  (!) 157/72 (!) 144/88  Pulse:  69 (!) 51  Resp:  18 18  Temp:  97.7 F (36.5 C)   TempSrc:  Oral   SpO2:  95% 97%  Weight: 112 kg    Height: 5\' 10"  (1.778 m)     Constitutional: Afebrile, in no major distress currently.  Reports improvement in his pain after pain medications given in the ED.  No shortness of breath. Eyes: PERRL, lids and conjunctivae normal; no icterus, no nystagmus. ENMT: Mucous membranes are moist. Posterior pharynx clear of any exudate or lesions. Normal dentition.  Neck: normal, supple, no masses, no thyromegaly, no JVD Respiratory: clear to auscultation bilaterally, no wheezing, no crackles. Normal respiratory effort. No accessory muscle use.  Cardiovascular: Regular rate, irregular rhythm; no murmurs, rubs or gallops. No extremity edema. 2+ pedal pulses. No carotid bruits.  Abdomen: Obese, distended, no guarding, currently no having significant discomfort.  Decreased to absent bowel sounds with high-pitched bubbles appreciated on the right side.  Musculoskeletal: no clubbing / cyanosis. No joint deformity upper and lower extremities. Good ROM, no contractures. Normal muscle tone.  Skin: no rashes, lesions, ulcers. No induration Neurologic: CN 2-12 grossly intact. Sensation intact, DTR normal. Strength 5/5 in all 4.  Psychiatric: Normal judgment and insight. Alert and oriented x 3. Normal mood.    Labs on Admission: I have personally reviewed the following labs and imaging studies  CBC: Recent Labs  Lab 04/01/18 0758  WBC 12.6*  NEUTROABS 9.6*  HGB 13.7  HCT 43.6  MCV 92.8  PLT 301   Basic Metabolic Panel: Recent Labs  Lab 04/01/18 0758  NA 135  K 4.2  CL 100  CO2 27  GLUCOSE 127*  BUN 20  CREATININE 1.02  CALCIUM 9.3   GFR: Estimated Creatinine Clearance: 77.2 mL/min (by C-G formula based on SCr of 1.02 mg/dL).    Liver Function Tests: Recent Labs  Lab 04/01/18 0758  AST 20  ALT 15  ALKPHOS 80  BILITOT 0.8  PROT 8.1  ALBUMIN 4.1   Recent Labs  Lab 04/01/18 0758  LIPASE 29   Urine analysis:    Component Value Date/Time   COLORURINE YELLOW 04/01/2018 0752   APPEARANCEUR CLEAR 04/01/2018 0752   LABSPEC 1.014 04/01/2018 0752   PHURINE 7.0 04/01/2018 0752   GLUCOSEU NEGATIVE 04/01/2018 0752   HGBUR NEGATIVE 04/01/2018 0752   BILIRUBINUR NEGATIVE 04/01/2018 0752   KETONESUR NEGATIVE 04/01/2018 0752   PROTEINUR NEGATIVE 04/01/2018 0752   UROBILINOGEN 0.2 10/21/2013 1930   NITRITE NEGATIVE 04/01/2018 0752   LEUKOCYTESUR NEGATIVE 04/01/2018 0752   Radiological Exams on Admission: Ct Abdomen Pelvis W Contrast  Result Date: 04/01/2018 CLINICAL DATA:  Abdominal pain and constipation EXAM: CT ABDOMEN AND PELVIS WITH CONTRAST TECHNIQUE: Multidetector CT imaging of the  abdomen and pelvis was performed using the standard protocol following bolus administration of intravenous contrast. CONTRAST:  ISOVUE-300 IOPAMIDOL (ISOVUE-300) INJECTION 61% COMPARISON:  November 02, 2015 FINDINGS: Lower chest: There is bibasilar atelectatic change. There are foci of coronary artery calcification. Hepatobiliary: There is hepatic steatosis. No focal liver lesions are evident. Gallbladder wall is not appreciably thickened. There is no biliary duct dilatation. Pancreas: No pancreatic mass or inflammatory focus. Spleen: No splenic lesions are evident. Adrenals/Urinary Tract: Adrenals bilaterally appear normal. There is no evident renal mass or hydronephrosis on either side. There is moderate renal sinus fat bilaterally, a finding of questionable clinical significance. There is no appreciable renal or ureteral calculus on either side. Urinary bladder is midline with wall thickness within normal limits. Stomach/Bowel: There are multiple descending colonic and sigmoid diverticula without diverticulitis. There is mild  gastric distention with fluid throughout the stomach. There are loops of dilated jejunum and proximal ileum. There is a relative transition zone in the proximal ileal region, felt to be indicative of a degree bowel obstruction. Most small bowel loops contain fluid, although there are scattered foci of air within small bowel loops. There is slight fluid adjacent to bowel in the mid abdomen just to the left of midline, likely due to inflammation secondary to developing bowel obstruction. No free air or portal venous air is demonstrable on this study. Mild mesenteric thickening is noted in the mid abdomen as well, likely due to developing bowel obstruction. There is no intramural air. Vascular/Lymphatic: There is aortic and iliac artery atherosclerosis. No aneurysm evident. There is calcification in the proximal major mesenteric arteries. No major mesenteric arterial obstruction is demonstrated on this study. There is no evident adenopathy in the abdomen or pelvis. Reproductive: There are prostatic calculi. Prostate and seminal vesicles are normal in size and contour. No pelvic mass evident. Other: Appendix absent. No periappendiceal region inflammation. There is no abscess in the abdomen or pelvis. There is no free-flowing ascites in the abdomen or pelvis. There is a small ventral hernia containing only fat. There is mesh along the anterior abdominal wall. Musculoskeletal: There are foci of degenerative change in the lower thoracic and lumbar spine regions. Patient is status post total hip replacement on the left. No blastic or lytic bone lesions. There is no intramuscular lesion. IMPRESSION: 1. There is felt to be a degree of small bowel obstruction with transition zone at the proximal ileal level. There is mild mid abdominal loculated fluid mesenteric thickening, felt to be changes secondary to developing small bowel obstruction. No free air evident. 2. No abscess in the abdomen or pelvis. Appendix absent. No  periappendiceal region inflammation. 3. Extensive left-sided diverticulosis without diverticulitis. No colonic obstruction evident. 4. 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 No major vessel occlusion. Note that there is no portal venous air or intramural air involving bowel to suggest bowel ischemia. There also foci of coronary artery calcification noted. 5. Postoperative change anterior abdominal wall. Small ventral hernia currently containing only fat. 6.  Status post left total hip replacement. Aortic Atherosclerosis (ICD10-I70.0). Electronically Signed   By: Bretta Bang III M.D.   On: 04/01/2018 09:35    EKG: Poor R wave progression, regular rate; positive Atrial fibrillation. Normal QT.  Assessment/Plan 1-abdominal pain/nausea: In the setting of SBO -Patient with prior history of abdominal surgery (ventral hernia repair and appendectomy) and CT scan demonstrating transition point, given concerns that his SBO presentation is secondary to adhesions. -For now will pursuit conservative management with  IV fluids, monitor and replete electrolytes as needed, will keep patient nothing by mouth, place NGT; continue as needed analgesics and antiemetics.  -Will check lactic acid -General surgery has been consulted.  2-essential hypertension -Blood pressure stable -Will use IV metoprolol. -As needed hydralazine has been ordered.  3-paroxysmal atrial fibrillation -Rate control and currently sinus -Will hold oral medications while n.p.o. (Coreg); use IV metoprolol instead. -Xarelto has been discontinue while n.p.o. and in anticipation of any surgical intervention. -Will use heparin for DVT prophylaxis at this time. -CHAdsVASC score 3  4-morbid obesity -Body mass index is 35.43 kg/m. -Low calorie diet, portion control and increase physical activity has been discussed with patient.  5-chronic systolic heart failure -Follow daily weights and strict intake and  output -Patient will be kept n.p.o. for now -Holding ACE inhibitors and diuretics -Continue IV beta-blocker -Last ejection fraction 40-45%.  6-hyperlipidemia -Will resume statins once able to take by mouth  7-history of gout -No acute flare appreciated -Resume the use of allopurinol once able to take by mouth.  DVT prophylaxis: Holding Xarelto at this moment; will use heparin for DVT prophylaxis. Code Status: Full code Family Communication: No family bedside. Disposition Plan: Home once condition stable and SBO resolved. Consults called: General surgery (Dr. Henreitta Leber) Admission status: Inpatient, length of stay more than 2 midnights; MedSurg.  It is my clinical decision to Mr. Gift will require more than 2 midnights hospitalization to stabilize his condition of SBO and given his risk factors of atrial fibrillation on chronic anticoagulation, hypertension and systolic heart failure, he will need close monitoring and inpatient treatment.   Time Spent: 70 minutes  Vassie Loll MD [facility] Pager [phone]  If 7PM-7AM, please contact night-coverage [email or web address] Password [facility]  04/01/2018, 11:24 AM

## 2018-04-01 NOTE — ED Notes (Signed)
Pt placed on 2L nasal cannula. Sats dropped to 88% on RA after dilaudid administration.

## 2018-04-01 NOTE — Progress Notes (Signed)
[facility] History and Physical  Reason for Referral: SBO  Referring Physician:  Dr. Gwenlyn Perking Dr. Estell Harpin  (ED)     Bryce Cook is a 77 y.o. male.  HPI: Bryce Cook is a 77 yo with a history of Afib, Cardiomyopathy, EF 40%, HTN and multiple episodes of SBO in the past. The last SBO was in 2015 when he was managed with nonoperative measures.  He has had at least 2 SBO in the past and has not required any operation. He reports a prior history of an appendectomy and mesh hernia repair. He has since developed the SBO and it is likely related to the hernia repair based on the CT scan.    He says he started to have abdominal pain and bloating that started about 12 hrs prior to his arrival. He has had some BMs with a small one this AM. He says that the pain is generalized and is constant. It is aching in nature.  He reports that the medications at the hospital made the pain better.   He has nausea but has not vomited. He has had NGs in the past.   Past Medical History:  Diagnosis Date  . A-fib (HCC)   . Abdominal hernia   . Arthritis   . Cardiomyopathy (HCC)    LVEF of 40-45% 2005, LVEF 60% 2013  . Essential hypertension, benign   . Gout   . Hyperlipidemia   . Small bowel obstruction [facility])     Past Surgical History:  Procedure Laterality Date  . APPENDECTOMY    . EYE SURGERY    . VENTRAL HERNIA REPAIR      Family History  Problem Relation Age of Onset  . Heart failure Mother   . Hypertension Mother   . Hyperlipidemia Mother   . Coronary artery disease Father        Died age 29 with MI  . Hypertension Father   . Hyperlipidemia Father     Social History   Tobacco Use  . Smoking status: Former Smoker    Types: Cigarettes    Last attempt to quit: 04/21/1977    Years since quitting: 40.9  . Smokeless tobacco: Never Used  Substance Use Topics  . Alcohol use: Yes    Comment: Occasionally  . Drug use: No    Medications: I have reviewed the patient's current  medications. Current Facility-Administered Medications  Medication Dose Route Frequency Provider Last Rate Last Dose  . 0.9 %  sodium chloride infusion   Intravenous Continuous Vassie Loll, MD 75 mL/hr at 04/01/18 1135    . heparin injection 5,000 Units  5,000 Units Subcutaneous Q8H Vassie Loll, MD      . metoprolol tartrate (LOPRESSOR) injection 2.5 mg  2.5 mg Intravenous Myriam Jacobson, MD      . ondansetron [facility]) injection 4 mg  4 mg Intravenous Q6H PRN Vassie Loll, MD      . pantoprazole (PROTONIX) injection 40 mg  40 mg Intravenous Q24H Vassie Loll, MD   40 mg at 04/01/18 1135   Current Outpatient Medications  Medication Sig Dispense Refill Last Dose  . allopurinol (ZYLOPRIM) 100 MG tablet Take 100 mg by mouth daily.    04/01/2018 at Unknown time  . carvedilol (COREG) 12.5 MG tablet Take 12.5 mg by mouth 2 (two) times daily with a meal.   04/01/2018 at 0600  . furosemide (LASIX) 40 MG tablet Take 40 mg by mouth daily.   04/01/2018 at Unknown time  .  lisinopril (PRINIVIL,ZESTRIL) 20 MG tablet Take 20 mg by mouth daily.   04/01/2018 at Unknown time  . rivaroxaban (XARELTO) 20 MG TABS tablet Take 1 tablet by mouth at bedtime.     . rosuvastatin (CRESTOR) 10 MG tablet Take 10 mg by mouth daily.   03/31/2018 at Unknown time  . alfuzosin (UROXATRAL) 10 MG 24 hr tablet Take 10 mg by mouth every evening.   11/01/2015 at Unknown time  . cetirizine (ZYRTEC) 10 MG tablet Take 10 mg by mouth daily as needed for allergies.    Past Week at Unknown time  . ibuprofen (ADVIL,MOTRIN) 800 MG tablet Take 800 mg by mouth every 8 (eight) hours as needed for moderate pain.   11/01/2015 at Unknown time   Allergies  Allergen Reactions  . Amiodarone Other (See Comments)    blister  . Lipitor [Atorvastatin] Other (See Comments)    "body tightness"  "couldn't get out of the bed"  . Sotalol Rash    ROS:  A comprehensive review of systems was negative except for: Gastrointestinal: positive for  abdominal pain, nausea and bloating  Blood pressure 125/77, pulse 97, temperature 97.7 F (36.5 C), temperature source Oral, resp. rate 17, height 5\' 10"  (1.778 m), weight 112 kg, SpO2 97 %. Physical Exam Vitals signs reviewed.  Constitutional:      Appearance: Normal appearance.  HENT:     Head: Normocephalic and atraumatic.  Eyes:     Pupils: Pupils are equal, round, and reactive to light.  Neck:     Musculoskeletal: Normal range of motion.  Cardiovascular:     Rate and Rhythm: Normal rate.  Pulmonary:     Effort: Pulmonary effort is normal.  Abdominal:     General: There is distension.     Palpations: Abdomen is soft.     Tenderness: There is generalized abdominal tenderness. There is no guarding or rebound.     Hernia: No hernia is present.     Comments: tympanic  Musculoskeletal: Normal range of motion.        General: Swelling present.  Skin:    General: Skin is warm and dry.  Neurological:     General: No focal deficit present.     Mental Status: He is alert and oriented to person, place, and time.  Psychiatric:        Mood and Affect: Mood normal.        Behavior: Behavior normal.        Thought Content: Thought content normal.        Judgment: Judgment normal.     Results: Results for orders placed or performed during the hospital encounter of 04/01/18 (from the past 48 hour(s))  Urinalysis, Routine w reflex microscopic     Status: None   Collection Time: 04/01/18  7:52 AM  Result Value Ref Range   Color, Urine YELLOW YELLOW   APPearance CLEAR CLEAR   Specific Gravity, Urine 1.014 1.005 - 1.030   pH 7.0 5.0 - 8.0   Glucose, UA NEGATIVE NEGATIVE mg/dL   Hgb urine dipstick NEGATIVE NEGATIVE   Bilirubin Urine NEGATIVE NEGATIVE   Ketones, ur NEGATIVE NEGATIVE mg/dL   Protein, ur NEGATIVE NEGATIVE mg/dL   Nitrite NEGATIVE NEGATIVE   Leukocytes, UA NEGATIVE NEGATIVE    Comment: Performed at [facility], [address], [place], [place] [address]  CBC with  Differential     Status: Abnormal   Collection Time: 04/01/18  7:58 AM  Result Value Ref Range  WBC 12.6 (H) 4.0 - 10.5 K/uL   RBC 4.70 4.22 - 5.81 MIL/uL   Hemoglobin 13.7 13.0 - 17.0 g/dL   HCT 81.1 91.4 - 78.2 %   MCV 92.8 80.0 - 100.0 fL   MCH 29.1 26.0 - 34.0 pg   MCHC 31.4 30.0 - 36.0 g/dL   RDW 95.6 21.3 - 08.6 %   Platelets 301 150 - 400 K/uL   nRBC 0.0 0.0 - 0.2 %   Neutrophils Relative % 77 %   Neutro Abs 9.6 (H) 1.7 - 7.7 K/uL   Lymphocytes Relative 12 %   Lymphs Abs 1.5 0.7 - 4.0 K/uL   Monocytes Relative 10 %   Monocytes Absolute 1.3 (H) 0.1 - 1.0 K/uL   Eosinophils Relative 1 %   Eosinophils Absolute 0.2 0.0 - 0.5 K/uL   Basophils Relative 0 %   Basophils Absolute 0.1 0.0 - 0.1 K/uL   Immature Granulocytes 0 %   Abs Immature Granulocytes 0.04 0.00 - 0.07 K/uL    Comment: Performed at [facility], [address], [place], [place] [address]  Comprehensive metabolic panel     Status: Abnormal   Collection Time: 04/01/18  7:58 AM  Result Value Ref Range   Sodium 135 135 - 145 mmol/L   Potassium 4.2 3.5 - 5.1 mmol/L   Chloride 100 98 - 111 mmol/L   CO2 27 22 - 32 mmol/L   Glucose, Bld 127 (H) 70 - 99 mg/dL   BUN 20 8 - 23 mg/dL   Creatinine, Ser 9.62 0.61 - 1.24 mg/dL   Calcium 9.3 8.9 - 95.2 mg/dL   Total Protein 8.1 6.5 - 8.1 g/dL   Albumin 4.1 3.5 - 5.0 g/dL   AST 20 15 - 41 U/L   ALT 15 0 - 44 U/L   Alkaline Phosphatase 80 38 - 126 U/L   Total Bilirubin 0.8 0.3 - 1.2 mg/dL   GFR calc non Af Amer >60 >60 mL/min   GFR calc Af Amer >60 >60 mL/min   Anion gap 8 5 - 15    Comment: Performed at [facility], [address] [place], [place] [address]  Lipase, blood     Status: None   Collection Time: 04/01/18  7:58 AM  Result Value Ref Range   Lipase 29 11 - 51 U/L    Comment: Performed at [facility], [address], [place], [place] [address]  Magnesium     Status: None   Collection Time: 04/01/18 11:21 AM  Result Value Ref Range   Magnesium 2.0 1.7 -  2.4 mg/dL    Comment: Performed at [facility], [address] [place], [place] [address]   Personally reviewed- area of SB in the anterior abdomen near mesh with tacks, dilated with fecalization transitioning to decompressed bowel  Ct Abdomen Pelvis W Contrast  Result Date: 04/01/2018 CLINICAL DATA:  Abdominal pain and constipation EXAM: CT ABDOMEN AND PELVIS WITH CONTRAST TECHNIQUE: Multidetector CT imaging of the abdomen and pelvis was performed using the standard protocol following bolus administration of intravenous contrast. CONTRAST:  ISOVUE-300 IOPAMIDOL (ISOVUE-300) INJECTION 61% COMPARISON:  November 02, 2015 FINDINGS: Lower chest: There is bibasilar atelectatic change. There are foci of coronary artery calcification. Hepatobiliary: There is hepatic steatosis. No focal liver lesions are evident. Gallbladder wall is not appreciably thickened. There is no biliary duct dilatation. Pancreas: No pancreatic mass or inflammatory focus. Spleen: No splenic lesions are evident. Adrenals/Urinary Tract: Adrenals bilaterally appear normal. There is no  evident renal mass or hydronephrosis on either side. There is moderate renal sinus fat bilaterally, a finding of questionable clinical significance. There is no appreciable renal or ureteral calculus on either side. Urinary bladder is midline with wall thickness within normal limits. Stomach/Bowel: There are multiple descending colonic and sigmoid diverticula without diverticulitis. There is mild gastric distention with fluid throughout the stomach. There are loops of dilated jejunum and proximal ileum. There is a relative transition zone in the proximal ileal region, felt to be indicative of a degree bowel obstruction. Most small bowel loops contain fluid, although there are scattered foci of air within small bowel loops. There is slight fluid adjacent to bowel in the mid abdomen just to the left of midline, likely due to inflammation secondary to developing  bowel obstruction. No free air or portal venous air is demonstrable on this study. Mild mesenteric thickening is noted in the mid abdomen as well, likely due to developing bowel obstruction. There is no intramural air. Vascular/Lymphatic: There is aortic and iliac artery atherosclerosis. No aneurysm evident. There is calcification in the proximal major mesenteric arteries. No major mesenteric arterial obstruction is demonstrated on this study. There is no evident adenopathy in the abdomen or pelvis. Reproductive: There are prostatic calculi. Prostate and seminal vesicles are normal in size and contour. No pelvic mass evident. Other: Appendix absent. No periappendiceal region inflammation. There is no abscess in the abdomen or pelvis. There is no free-flowing ascites in the abdomen or pelvis. There is a small ventral hernia containing only fat. There is mesh along the anterior abdominal wall. Musculoskeletal: There are foci of degenerative change in the lower thoracic and lumbar spine regions. Patient is status post total hip replacement on the left. No blastic or lytic bone lesions. There is no intramuscular lesion. IMPRESSION: 1. There is felt to be a degree of small bowel obstruction with transition zone at the proximal ileal level. There is mild mid abdominal loculated fluid mesenteric thickening, felt to be changes secondary to developing small bowel obstruction. No free air evident. 2. No abscess in the abdomen or pelvis. Appendix absent. No periappendiceal region inflammation. 3. Extensive left-sided diverticulosis without diverticulitis. No colonic obstruction evident. 4. 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 No major vessel occlusion. Note that there is no portal venous air or intramural air involving bowel to suggest bowel ischemia. There also foci of coronary artery calcification noted. 5. Postoperative change anterior abdominal wall. Small ventral hernia currently containing  only fat. 6.  Status post left total hip replacement. Aortic Atherosclerosis (ICD10-I70.0). Electronically Signed   By: Bretta Bang III M.D.   On: 04/01/2018 09:35     Assessment & Plan:  Bryce Cook is a 77 y.o. male with SBO. He is very distended but has not vomited. Given the distention recommend NG tube.   -Admit to medicine  -NPO, NG to be placed, asked RN to place largest possible -Correct lytes, K to 4, Phos to 3, Mg to 2 -Monitor abdominal exam -Will follow   All questions were answered to the satisfaction of the patient and family.  Updated Dr. Gwenlyn Perking.   Lucretia Roers 04/01/2018, 12:46 PM

## 2018-04-02 ENCOUNTER — Inpatient Hospital Stay (HOSPITAL_COMMUNITY): Payer: Medicare PPO

## 2018-04-02 DIAGNOSIS — E782 Mixed hyperlipidemia: Secondary | ICD-10-CM

## 2018-04-02 DIAGNOSIS — Z6835 Body mass index (BMI) 35.0-35.9, adult: Secondary | ICD-10-CM

## 2018-04-02 DIAGNOSIS — I4811 Longstanding persistent atrial fibrillation: Secondary | ICD-10-CM

## 2018-04-02 DIAGNOSIS — K56609 Unspecified intestinal obstruction, unspecified as to partial versus complete obstruction: Principal | ICD-10-CM

## 2018-04-02 DIAGNOSIS — E661 Drug-induced obesity: Secondary | ICD-10-CM

## 2018-04-02 LAB — CBC
HCT: 41.7 % (ref 39.0–52.0)
Hemoglobin: 12.9 g/dL — ABNORMAL LOW (ref 13.0–17.0)
MCH: 29.7 pg (ref 26.0–34.0)
MCHC: 30.9 g/dL (ref 30.0–36.0)
MCV: 95.9 fL (ref 80.0–100.0)
Platelets: 265 10*3/uL (ref 150–400)
RBC: 4.35 MIL/uL (ref 4.22–5.81)
RDW: 15.2 % (ref 11.5–15.5)
WBC: 10.3 10*3/uL (ref 4.0–10.5)
nRBC: 0 % (ref 0.0–0.2)

## 2018-04-02 LAB — BASIC METABOLIC PANEL
Anion gap: 6 (ref 5–15)
BUN: 20 mg/dL (ref 8–23)
CALCIUM: 9.2 mg/dL (ref 8.9–10.3)
CO2: 29 mmol/L (ref 22–32)
Chloride: 105 mmol/L (ref 98–111)
Creatinine, Ser: 1.08 mg/dL (ref 0.61–1.24)
GFR calc Af Amer: >60 mL/min (ref >60)
Glucose, Bld: 126 mg/dL — ABNORMAL HIGH (ref 70–99)
Potassium: 4.9 mmol/L (ref 3.5–5.1)
Sodium: 140 mmol/L (ref 135–145)

## 2018-04-02 LAB — GLUCOSE, CAPILLARY: Glucose-Capillary: 98 mg/dL (ref 70–99)

## 2018-04-02 LAB — MAGNESIUM: Magnesium: 2 mg/dL (ref 1.7–2.4)

## 2018-04-02 LAB — PHOSPHORUS: Phosphorus: 3.4 mg/dL (ref 2.5–4.6)

## 2018-04-02 MED ORDER — MORPHINE SULFATE (PF) 2 MG/ML IV SOLN
2.0000 mg | Freq: Once | INTRAVENOUS | Status: AC
  Administered 2018-04-02: 2 mg via INTRAVENOUS
  Filled 2018-04-02: qty 1

## 2018-04-02 NOTE — Progress Notes (Addendum)
PROGRESS NOTE    Bryce Cook  UEA:[id number] DOB: [birth year] DOA: 04/01/2018 PCP: Zachery Dauer, MD     Brief Narrative:  77 y.o. male with a past medical history significant for atrial fibrillation (chronically on Xarelto), systolic heart failure (last ejection fraction up from 45% to 60%); hypertension, hyperlipidemia, gout and prior history of small bowel obstruction; who presented to the hospital secondary to abdominal pain and nausea.  Patient reported having abdominal pain bloating sensation status approximately 12 hours prior to his arrival to the ED.  Patient expressed that his last bowel movement was earlier this morning (small and very hard; no blood, no mucus).  Patient not having any vomiting, but express associated nausea with ongoing abdominal pain. Pain is generalized, constant, aching in nature, 9 out of 10 in intensity, improved with pain medications in the ED, otherwise no significant alleviating factors.  Patient reports no fever, no chills, positive decreased appetite.   Patient also denies chest pain, shortness of breath, melena, hematochezia, dysuria, hematuria, headache, blurred vision, focal weakness.   Assessment & Plan: 1-SBO (small bowel obstruction) (HCC) -abdomen still distended  -no nausea or vomiting, pain is better -continue NGT and NPO status -no BM or flatus reported  -follow electrolytes and replete as needed  2-Essential hypertension, benign -continue IV metoprolol  -BP stable  3-Mixed hyperlipidemia -resume statins when able to tolerate PO  4-Atrial fibrillation (HCC) -continue rate control with IV b-blocker -holding on anticoagulation for now  5-systolic HF: chronic -stable and compensated -will follow daily weight and strict I's and O's -continue holding diuretics  -las EF improved and into 60's% range  6-morbid obesity -low calorie diet, portion control and exercise discussed with patient. -Body mass index is 35.93 kg/m.  7-hx of  gout -no acute flare appreciated -resume allopurinol when tolerating Po's  DVT prophylaxis: heparin  Code Status: Full Family Communication: wife at bedside  Disposition Plan: remains inpatient, continue conservative management; continue NPO and NGT in place.   Consultants:   General surgery   Procedures:   See below for x-ray reports   Antimicrobials:  Anti-infectives (From admission, onward)   None      Subjective: Afebrile, no CP, no palpitations, no nausea, no vomiting and after pain meds given reports improvement in abd pain. No BM and no flatus reported.  Objective: Vitals:   04/01/18 2133 04/01/18 2351 04/02/18 0500 04/02/18 0622  BP:  114/65  114/60  Pulse: 88 66  70  Resp:    17  Temp:    98.2 F (36.8 C)  TempSrc:    Oral  SpO2:    97%  Weight:   113.6 kg   Height:        Intake/Output Summary (Last 24 hours) at 04/02/2018 1259 Last data filed at 04/02/2018 0655 Gross per 24 hour  Intake 2149.86 ml  Output 1150 ml  Net 999.86 ml   Filed Weights   04/01/18 0748 04/01/18 1307 04/02/18 0500  Weight: 112 kg 115 kg 113.6 kg    Examination: General exam: Alert, awake, oriented x 3; reports improvement in his abd pain and not having nausea or vomiting currently. NGT in place. Patient is afebrile. Respiratory system: Clear to auscultation. Respiratory effort normal. Cardiovascular system:Rate controlled. No murmurs, rubs, gallops. Gastrointestinal system: Abdomen is still distended, soft and only mildly tender with deep palpation. Bowel sounds still decrease. ontender.  Central nervous system: Alert and oriented. No focal neurological deficits. Extremities: No C/C/E, +pedal pulses Skin: No rashes,  lesions or ulcers Psychiatry: Judgement and insight appear normal. Mood & affect appropriate.   Data Reviewed: I have personally reviewed following labs and imaging studies  CBC: Recent Labs  Lab 04/01/18 0758 04/02/18 0448  WBC 12.6* 10.3  NEUTROABS  9.6*  --   HGB 13.7 12.9*  HCT 43.6 41.7  MCV 92.8 95.9  PLT 301 265   Basic Metabolic Panel: Recent Labs  Lab 04/01/18 0758 04/01/18 1121 04/02/18 0448  NA 135  --  140  K 4.2  --  4.9  CL 100  --  105  CO2 27  --  29  GLUCOSE 127*  --  126*  BUN 20  --  20  CREATININE 1.02  --  1.08  CALCIUM 9.3  --  9.2  MG  --  2.0 2.0  PHOS  --   --  3.4   GFR: Estimated Creatinine Clearance: 73.4 mL/min (by C-G formula based on SCr of 1.08 mg/dL).   Liver Function Tests: Recent Labs  Lab 04/01/18 0758  AST 20  ALT 15  ALKPHOS 80  BILITOT 0.8  PROT 8.1  ALBUMIN 4.1   Recent Labs  Lab 04/01/18 0758  LIPASE 29   Urine analysis:    Component Value Date/Time   COLORURINE YELLOW 04/01/2018 0752   APPEARANCEUR CLEAR 04/01/2018 0752   LABSPEC 1.014 04/01/2018 0752   PHURINE 7.0 04/01/2018 0752   GLUCOSEU NEGATIVE 04/01/2018 0752   HGBUR NEGATIVE 04/01/2018 0752   BILIRUBINUR NEGATIVE 04/01/2018 0752   KETONESUR NEGATIVE 04/01/2018 0752   PROTEINUR NEGATIVE 04/01/2018 0752   UROBILINOGEN 0.2 10/21/2013 1930   NITRITE NEGATIVE 04/01/2018 0752   LEUKOCYTESUR NEGATIVE 04/01/2018 0752   Radiology Studies: Ct Abdomen Pelvis W Contrast  Result Date: 04/01/2018 CLINICAL DATA:  Abdominal pain and constipation EXAM: CT ABDOMEN AND PELVIS WITH CONTRAST TECHNIQUE: Multidetector CT imaging of the abdomen and pelvis was performed using the standard protocol following bolus administration of intravenous contrast. CONTRAST:  ISOVUE-300 IOPAMIDOL (ISOVUE-300) INJECTION 61% COMPARISON:  November 02, 2015 FINDINGS: Lower chest: There is bibasilar atelectatic change. There are foci of coronary artery calcification. Hepatobiliary: There is hepatic steatosis. No focal liver lesions are evident. Gallbladder wall is not appreciably thickened. There is no biliary duct dilatation. Pancreas: No pancreatic mass or inflammatory focus. Spleen: No splenic lesions are evident. Adrenals/Urinary Tract:  Adrenals bilaterally appear normal. There is no evident renal mass or hydronephrosis on either side. There is moderate renal sinus fat bilaterally, a finding of questionable clinical significance. There is no appreciable renal or ureteral calculus on either side. Urinary bladder is midline with wall thickness within normal limits. Stomach/Bowel: There are multiple descending colonic and sigmoid diverticula without diverticulitis. There is mild gastric distention with fluid throughout the stomach. There are loops of dilated jejunum and proximal ileum. There is a relative transition zone in the proximal ileal region, felt to be indicative of a degree bowel obstruction. Most small bowel loops contain fluid, although there are scattered foci of air within small bowel loops. There is slight fluid adjacent to bowel in the mid abdomen just to the left of midline, likely due to inflammation secondary to developing bowel obstruction. No free air or portal venous air is demonstrable on this study. Mild mesenteric thickening is noted in the mid abdomen as well, likely due to developing bowel obstruction. There is no intramural air. Vascular/Lymphatic: There is aortic and iliac artery atherosclerosis. No aneurysm evident. There is calcification in the proximal major mesenteric arteries.  No major mesenteric arterial obstruction is demonstrated on this study. There is no evident adenopathy in the abdomen or pelvis. Reproductive: There are prostatic calculi. Prostate and seminal vesicles are normal in size and contour. No pelvic mass evident. Other: Appendix absent. No periappendiceal region inflammation. There is no abscess in the abdomen or pelvis. There is no free-flowing ascites in the abdomen or pelvis. There is a small ventral hernia containing only fat. There is mesh along the anterior abdominal wall. Musculoskeletal: There are foci of degenerative change in the lower thoracic and lumbar spine regions. Patient is status  post total hip replacement on the left. No blastic or lytic bone lesions. There is no intramuscular lesion. IMPRESSION: 1. There is felt to be a degree of small bowel obstruction with transition zone at the proximal ileal level. There is mild mid abdominal loculated fluid mesenteric thickening, felt to be changes secondary to developing small bowel obstruction. No free air evident. 2. No abscess in the abdomen or pelvis. Appendix absent. No periappendiceal region inflammation. 3. Extensive left-sided diverticulosis without diverticulitis. No colonic obstruction evident. 4. 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 No major vessel occlusion. Note that there is no portal venous air or intramural air involving bowel to suggest bowel ischemia. There also foci of coronary artery calcification noted. 5. Postoperative change anterior abdominal wall. Small ventral hernia currently containing only fat. 6.  Status post left total hip replacement. Aortic Atherosclerosis (ICD10-I70.0). Electronically Signed   By: Bretta Bang III M.D.   On: 04/01/2018 09:35   Dg Chest Portable 1 View  Result Date: 04/01/2018 CLINICAL DATA:  Nasogastric tube placement EXAM: PORTABLE CHEST 1 VIEW COMPARISON:  Chest radiograph 10/21/2013 FINDINGS: Nonvisualization of the nasogastric tube below about the level of the carina. This is mostly due to poor penetration. Coiled tubing noted over the left upper quadrant but this may represent part of the tube outside of the patient. Indistinct pulmonary vasculature. Indistinct left heart border. Suspected cardiomegaly. IMPRESSION: 1. Nasogastric tube is not visualized below about the level of the carina. This lack of visualization could be due to technical factors and lack of penetration rather than the tube actually being stuck in the mid esophagus or at the carina. Correlation with any new pulmonary symptoms is clearly warranted. Repeat radiography is recommended. 2.  Cardiomegaly with indistinct pulmonary vasculature favoring pulmonary venous hypertension. These results will be called to the ordering clinician or representative by the Radiologist Assistant, and communication documented in the PACS or zVision Dashboard. Electronically Signed   By: Gaylyn Rong M.D.   On: 04/01/2018 14:00   Dg Chest Port 1v Same Day  Result Date: 04/01/2018 CLINICAL DATA:  Nasogastric tube placement EXAM: PORTABLE CHEST 1 VIEW COMPARISON:  Repeat portable exam at 1547 hrs compared to earlier studies of 04/01/2018 FINDINGS: Nasogastric tube traverses thoracic esophagus and is coiled within the stomach with the tip at the distal gastric antrum. This could be withdrawn 15 cm and still have sufficient tube within the stomach. Enlargement of cardiac silhouette. Mediastinal contours normal. Atherosclerotic calcifications aorta. Mild bibasilar atelectasis without infiltrate or pleural effusion. No pneumothorax. IMPRESSION: Bibasilar atelectasis. Enlargement of cardiac silhouette. Nasogastric tube within stomach with tip at distal gastric antrum; this could be partially withdrawn approximately 15 cm and still have sufficient tube length within the stomach. Electronically Signed   By: Ulyses Southward M.D.   On: 04/01/2018 16:10   Dg Chest Port 1v Same Day  Result Date: 04/01/2018 CLINICAL DATA:  NG tube  placement EXAM: PORTABLE CHEST 1 VIEW COMPARISON:  Portable chest x-ray from earlier today FINDINGS: The films very light in technique. However, the tip of the endotracheal tube is seen only to the midthoracic esophagus. The lungs are not well aerated. Moderate cardiomegaly is stable. Mild pulmonary vascular congestion and not be excluded. IMPRESSION: 1. NG tube tip seen only to the midthoracic esophagus, with the film being very light in technique. 2. Stable cardiomegaly. Can not exclude mild pulmonary vascular congestion. Electronically Signed   By: Dwyane Dee M.D.   On: 04/01/2018 15:01    Dg Abd Portable 1v  Result Date: 04/02/2018 CLINICAL DATA:  NG tube placement. EXAM: PORTABLE ABDOMEN - 1 VIEW COMPARISON:  CT scan 04/01/2018 FINDINGS: The NG tube tip is in the antropyloric region of the stomach. The lung bases are grossly clear. Mild cardiac enlargement. IMPRESSION: NG tube in good position with the tip in the antropyloric region of the stomach. Electronically Signed   By: Rudie Meyer M.D.   On: 04/02/2018 07:52    Scheduled Meds: . heparin injection (subcutaneous)  5,000 Units Subcutaneous Q8H  . metoprolol tartrate  2.5 mg Intravenous Q8H  . pantoprazole (PROTONIX) IV  40 mg Intravenous Q24H   Continuous Infusions: . sodium chloride 75 mL/hr at 04/01/18 2353     LOS: 1 day    Time spent: 25 minutes   Vassie Loll, MD [facility] Pager [phone]  If 7PM-7AM, please contact night-coverage [email or web address] Password [facility] 04/02/2018, 12:59 PM

## 2018-04-02 NOTE — Progress Notes (Signed)
[facility] Progress Note     Subjective: Remains distended. NG was not working per his report. RN has been flushing. Output after initially placed was 1 canister per the patient. Now with about 400cc in canister.  No nausea/ No flatus or BM.   Objective: Vital signs in last 24 hours: Temp:  [97.5 F (36.4 C)-98.4 F (36.9 C)] 98.2 F (36.8 C) (12/13 0622) Pulse Rate:  [66-88] 70 (12/13 0622) Resp:  [17-20] 17 (12/13 0622) BP: (114-150)/(60-78) 114/60 (12/13 0622) SpO2:  [95 %-97 %] 97 % (12/13 0622) Weight:  [113.6 kg-115 kg] 113.6 kg (12/13 0500) Last BM Date: 04/01/18  Intake/Output from previous day: 12/12 0701 - 12/13 0700 In: 2149.9 [I.V.:1449.9; NG/GT:700] Out: 1150 [Urine:800; Emesis/NG output:350] Intake/Output this shift: No intake/output data recorded.  General appearance: alert, cooperative and no distress Resp: normal work breathing GI: soft, distended, tympanic, no rebound or guarding, minimally tender  Lab Results:  Recent Labs    04/01/18 0758 04/02/18 0448  WBC 12.6* 10.3  HGB 13.7 12.9*  HCT 43.6 41.7  PLT 301 265   BMET Recent Labs    04/01/18 0758 04/02/18 0448  NA 135 140  K 4.2 4.9  CL 100 105  CO2 27 29  GLUCOSE 127* 126*  BUN 20 20  CREATININE 1.02 1.08  CALCIUM 9.3 9.2     Assessment/Plan: Bryce Cook is a 77 yo with SBO.  NG in place and is working now. No return of bowel function yet.  -NPO, NG to remain in place -Correct lytes K 4, Phos 3, Mg 2 -Continue to monitor -If no improvement by Monday, will plan for SBFT with gastrografin on Monday    LOS: 1 day    Lucretia Roers 04/02/2018

## 2018-04-03 ENCOUNTER — Inpatient Hospital Stay (HOSPITAL_COMMUNITY): Payer: Medicare PPO

## 2018-04-03 MED ORDER — FUROSEMIDE 10 MG/ML IJ SOLN
40.0000 mg | Freq: Every day | INTRAMUSCULAR | Status: DC
  Administered 2018-04-03 – 2018-04-05 (×3): 40 mg via INTRAVENOUS
  Filled 2018-04-03 (×3): qty 4

## 2018-04-03 MED ORDER — KETOROLAC TROMETHAMINE 15 MG/ML IJ SOLN
15.0000 mg | Freq: Four times a day (QID) | INTRAMUSCULAR | Status: DC | PRN
  Administered 2018-04-03 – 2018-04-05 (×3): 15 mg via INTRAVENOUS
  Filled 2018-04-03 (×3): qty 1

## 2018-04-03 NOTE — Progress Notes (Signed)
Follow up chest film with positive signs of pulmonary edema, will hold on IV fluids and will start furosemide 40 mg Iv daily. Follow urine output.

## 2018-04-03 NOTE — Progress Notes (Signed)
PROGRESS NOTE    Bryce Cook  JJK:[id number] DOB: [birth year] DOA: 04/01/2018 PCP: Zachery Dauer, MD    Brief Narrative:  77 year old male who presented with abdominal pain and nausea.  He does have significant past medical history for atrial fibrillation, systolic heart failure, hypertension, dyslipidemia and gout.  Reported 12 hours of abdominal pain, associated with nausea and decreased appetite.  Hed came to the hospital due to persistent and worsening symptoms.  Initial physical examination blood pressure 157/72, heart rate 69, respirate 18, temp 97.7, oxygen saturation 95%, moist mucous membranes, lungs clear to auscultation bilaterally, heart S1-2 present rhythmic, abdomen was distended, decreased pale bowel sounds, no lower extremity edema.  CT of the abdomen with positive small bowel obstruction with transition zone of the proximal ileal level.  Patient was admitted to the hospital with working diagnosis of small bowel obstruction.   Assessment & Plan:   Principal Problem:   SBO (small bowel obstruction) (HCC) Active Problems:   Essential hypertension, benign   Mixed hyperlipidemia   Atrial fibrillation (HCC)  1. Small bowel obstruction. Patient continue to have high output from NG tube 1000 ml, will continue NG tube to wall suction. No nausea and abdominal pain is controlled with analgesics. Case discussed with Dr. Henreitta Leber.   2. Persistent cough. Will continue incentive spirometer, will add bronchodilator therapy as needed and will follow on chest film. Out of bed as tolerated. Continue oxymetry monitoring and aspiration precautions.   3. Atrial fibrillation. Continue rate control with metoprolol, continue holding anticoagulation for now.   4. Systolic heart failure. Chronic and stable, no signs of volume overload.   5. Morbid obesity. Calculated BMI 35. Will need outpatient follow up.   6. HTN and dyslipidemia.  Holding medical therapy due to bowel obstruction.   DVT  prophylaxis: scd. heparin  Code Status: full Family Communication: I spoke with patient's wife at the bedside and all questions were addressed.  Disposition Plan/ discharge barriers: pending clinical improvement.   Body mass index is 35.62 kg/m. Malnutrition Type:      Malnutrition Characteristics:      Nutrition Interventions:     RN Pressure Injury Documentation:     Consultants:   Surgery   Procedures:     Antimicrobials:       Subjective: Patient continue to have cough and increase phlegm, associated with headache. Continue to have high output from NG tube.   Objective: Vitals:   04/02/18 2218 04/03/18 0542 04/03/18 0542 04/03/18 0544  BP:   (!) 152/56   Pulse: 71 69 (!) 58   Resp:   17   Temp:   (!) 97.5 F (36.4 C)   TempSrc:   Oral   SpO2:   93%   Weight:    112.6 kg  Height:        Intake/Output Summary (Last 24 hours) at 04/03/2018 1212 Last data filed at 04/03/2018 1139 Gross per 24 hour  Intake 1602.06 ml  Output 1800 ml  Net -197.94 ml   Filed Weights   04/01/18 1307 04/02/18 0500 04/03/18 0544  Weight: 115 kg 113.6 kg 112.6 kg    Examination:   General: deconditioned and ill looking appearing  Neurology: Awake and alert, non focal  E ENT: mild pallor, no icterus, oral mucosa moist/ NG tube in place.  Cardiovascular: No JVD. S1-S2 present, rhythmic, no gallops, rubs, or murmurs. No lower extremity edema. Pulmonary: positive breath sounds bilaterally, poor air movement, no wheezing, rhonchi or rales. Gastrointestinal. Abdomen  distended and tympanic with no organomegaly, non tender, no rebound or guarding Skin. No rashes Musculoskeletal: no joint deformities     Data Reviewed: I have personally reviewed following labs and imaging studies  CBC: Recent Labs  Lab 04/01/18 0758 04/02/18 0448  WBC 12.6* 10.3  NEUTROABS 9.6*  --   HGB 13.7 12.9*  HCT 43.6 41.7  MCV 92.8 95.9  PLT 301 265   Basic Metabolic  Panel: Recent Labs  Lab 04/01/18 0758 04/01/18 1121 04/02/18 0448  NA 135  --  140  K 4.2  --  4.9  CL 100  --  105  CO2 27  --  29  GLUCOSE 127*  --  126*  BUN 20  --  20  CREATININE 1.02  --  1.08  CALCIUM 9.3  --  9.2  MG  --  2.0 2.0  PHOS  --   --  3.4   GFR: Estimated Creatinine Clearance: 73.1 mL/min (by C-G formula based on SCr of 1.08 mg/dL). Liver Function Tests: Recent Labs  Lab 04/01/18 0758  AST 20  ALT 15  ALKPHOS 80  BILITOT 0.8  PROT 8.1  ALBUMIN 4.1   Recent Labs  Lab 04/01/18 0758  LIPASE 29   No results for input(s): AMMONIA in the last 168 hours. Coagulation Profile: No results for input(s): INR, PROTIME in the last 168 hours. Cardiac Enzymes: No results for input(s): CKTOTAL, CKMB, CKMBINDEX, TROPONINI in the last 168 hours. BNP (last 3 results) No results for input(s): PROBNP in the last 8760 hours. HbA1C: No results for input(s): HGBA1C in the last 72 hours. CBG: Recent Labs  Lab 04/02/18 2045  GLUCAP 98   Lipid Profile: No results for input(s): CHOL, HDL, LDLCALC, TRIG, CHOLHDL, LDLDIRECT in the last 72 hours. Thyroid Function Tests: No results for input(s): TSH, T4TOTAL, FREET4, T3FREE, THYROIDAB in the last 72 hours. Anemia Panel: No results for input(s): VITAMINB12, FOLATE, FERRITIN, TIBC, IRON, RETICCTPCT in the last 72 hours.    Radiology Studies: I have reviewed all of the imaging during this hospital visit personally     Scheduled Meds: . heparin injection (subcutaneous)  5,000 Units Subcutaneous Q8H  . metoprolol tartrate  2.5 mg Intravenous Q8H  . pantoprazole (PROTONIX) IV  40 mg Intravenous Q24H   Continuous Infusions: . sodium chloride 75 mL/hr at 04/03/18 0215     LOS: 2 days        Coralie Keens, MD [facility] Pager [phone]

## 2018-04-03 NOTE — Progress Notes (Signed)
[facility] Progress Note     Subjective: Distention improving. Did not walk yesterday as RN did not unhook him per his report.  Abdomen less tender. NG output 1L recorded. Some flatus but no BM.   Objective: Vital signs in last 24 hours: Temp:  [97.5 F (36.4 C)-97.8 F (36.6 C)] 97.5 F (36.4 C) (12/14 0542) Pulse Rate:  [58-71] 58 (12/14 0542) Resp:  [17-20] 17 (12/14 0542) BP: (132-152)/(56-67) 152/56 (12/14 0542) SpO2:  [93 %-98 %] 93 % (12/14 0542) Weight:  [112.6 kg] 112.6 kg (12/14 0544) Last BM Date: 04/01/18  Intake/Output from previous day: 12/13 0701 - 12/14 0700 In: 1602.1 [I.V.:1602.1] Out: 1000 [Emesis/NG output:1000] Intake/Output this shift: No intake/output data recorded.  General appearance: alert, cooperative and no distress Resp: normal work breathing GI: soft, improving distention, no reound or guarding, minimally tender  Lab Results:  Recent Labs    04/01/18 0758 04/02/18 0448  WBC 12.6* 10.3  HGB 13.7 12.9*  HCT 43.6 41.7  PLT 301 265   BMET Recent Labs    04/01/18 0758 04/02/18 0448  NA 135 140  K 4.2 4.9  CL 100 105  CO2 27 29  GLUCOSE 127* 126*  BUN 20 20  CREATININE 1.02 1.08  CALCIUM 9.3 9.2   PT/INR No results for input(s): LABPROT, INR in the last 72 hours.  Studies/Results: AXR from 12/14 with paucity of gas, consistent with SBO  Assessment/Plan: Mr. Der is a 77 yo with a SBO. Starting have some flatus. He has had multiple SBO in the past.  Overall doing better. Ambulate off suction and then hook back up after  Keep NG tube and NPO, ice chips ok Patient complaining of cough, says that he gets PNA often, Notified Dr. Ella Jubilee for CXR orders   LOS: 2 days    Lucretia Roers 04/03/2018

## 2018-04-04 LAB — BASIC METABOLIC PANEL
Anion gap: 9 (ref 5–15)
BUN: 24 mg/dL — ABNORMAL HIGH (ref 8–23)
CO2: 25 mmol/L (ref 22–32)
Calcium: 9.8 mg/dL (ref 8.9–10.3)
Chloride: 106 mmol/L (ref 98–111)
Creatinine, Ser: 0.97 mg/dL (ref 0.61–1.24)
GFR calc Af Amer: >60 mL/min (ref >60)
GFR calc non Af Amer: >60 mL/min (ref >60)
Glucose, Bld: 101 mg/dL — ABNORMAL HIGH (ref 70–99)
Potassium: 4.4 mmol/L (ref 3.5–5.1)
Sodium: 140 mmol/L (ref 135–145)

## 2018-04-04 MED ORDER — FUROSEMIDE 10 MG/ML IJ SOLN
20.0000 mg | Freq: Once | INTRAMUSCULAR | Status: AC
  Administered 2018-04-04: 20 mg via INTRAVENOUS
  Filled 2018-04-04: qty 2

## 2018-04-04 MED ORDER — PHENOL 1.4 % MT LIQD
1.0000 | OROMUCOSAL | Status: DC | PRN
  Administered 2018-04-04: 1 via OROMUCOSAL
  Filled 2018-04-04: qty 177

## 2018-04-04 NOTE — Progress Notes (Addendum)
States feels more congested.  Vitals good.  Does sound worse than this morning  Contacted Dr. Gwenlyn Perking by text and page.  He called back and ordered one time dose of lasix 20 iv.

## 2018-04-04 NOTE — Progress Notes (Signed)
PROGRESS NOTE    Bryce Cook  ESP:[id number] DOB: [birth year] DOA: 04/01/2018 PCP: Zachery Dauer, MD     Brief Narrative:  77 y.o. male with a past medical history significant for atrial fibrillation (chronically on Xarelto), systolic heart failure (last ejection fraction up from 45% to 60%); hypertension, hyperlipidemia, gout and prior history of small bowel obstruction; who presented to the hospital secondary to abdominal pain and nausea.  Patient reported having abdominal pain bloating sensation status approximately 12 hours prior to his arrival to the ED.  Patient expressed that his last bowel movement was earlier this morning (small and very hard; no blood, no mucus).  Patient not having any vomiting, but express associated nausea with ongoing abdominal pain. Pain is generalized, constant, aching in nature, 9 out of 10 in intensity, improved with pain medications in the ED, otherwise no significant alleviating factors.  Patient reports no fever, no chills, positive decreased appetite.   Patient also denies chest pain, shortness of breath, melena, hematochezia, dysuria, hematuria, headache, blurred vision, focal weakness.   Assessment & Plan: 1-SBO (small bowel obstruction) (HCC) -abdomen is still distended and increase output on NGT. -no nausea or vomiting, pain has continue improving.   -continue NGT and NPO status  -follow General Surgery recommendations; plan for SBFT tomorrow. -No BM reported; but patient states he has been passing flatus  -follow electrolytes and further replete as needed  2-Essential hypertension, benign -continue IV metoprolol  -BP stable  3-Mixed hyperlipidemia -resume statins when able to tolerate PO  4-Atrial fibrillation (HCC) -continue rate control with IV b-blocker -continue holding anticoagulation for now  5-systolic HF: chronic -stable and compensated currently -after IVF's given initially, he developed mild vascular congestion. -will  continue to follow daily weights and strict intake/output  -will continue IV lasix for now. -last EF improved and into 60's% range.   6-morbid obesity -low calorie diet, portion control and exercise discussed with patient.  -Body mass index is 35.62 kg/m.  7-hx of gout -no acute flare appreciated.  -resume allopurinol when tolerating PO's  DVT prophylaxis: Heparin  Code Status: Full Family Communication: wife at bedside. All questions and concerns were addressed.   Disposition Plan: Continue NPO and NGT in place. Continue conservative management. Remains inpatient. Continue following general surgery recommendations.   Consultants:   General surgery   Procedures:   See below for x-ray reports    Antimicrobials:  Anti-infectives (From admission, onward)   None      Subjective: Still no bowel movements. Patient still has not had a bowel movement, but reports passing flatus. Patient reports improvement on his pain. No fevers, no chest pain, no nausea or vomiting.   Objective: Vitals:   04/03/18 2058 04/03/18 2225 04/04/18 0622 04/04/18 1340  BP:  (!) 136/57 (!) 150/76 (!) 157/81  Pulse:  61 79 82  Resp:  18  16  Temp:  97.8 F (36.6 C) (!) 97.3 F (36.3 C)   TempSrc:  Oral Oral   SpO2: 98% 97% 95% 98%  Weight:      Height:        Intake/Output Summary (Last 24 hours) at 04/04/2018 1424 Last data filed at 04/04/2018 0616 Gross per 24 hour  Intake -  Output 1750 ml  Net -1750 ml   Filed Weights   04/01/18 1307 04/02/18 0500 04/03/18 0544  Weight: 115 kg 113.6 kg 112.6 kg    Examination: General exam: Alert, awake, oriented x 3. Reports improvement of abd pain. NGT in  place. Patient is afebrile. Breathing easier and expressed good urine output.  Respiratory system: Clear to auscultation. Respiratory effort normal. Cardiovascular system:Rate controlled. No murmurs, rubs, gallops. Gastrointestinal system: Abdomen is still distended, soft and mildly tender with  deep palpation. Bowel sounds still decreased. Nontender. Central nervous system: Alert and oriented. No focal neurological deficits. Extremities: No C/C/E, +pedal pulses Skin: No rashes, lesions or ulcers Psychiatry: Judgement and insight appear normal. Mood & affect appropriate.   Data Reviewed: I have personally reviewed following labs and imaging studies  CBC: Recent Labs  Lab 04/01/18 0758 04/02/18 0448  WBC 12.6* 10.3  NEUTROABS 9.6*  --   HGB 13.7 12.9*  HCT 43.6 41.7  MCV 92.8 95.9  PLT 301 265   Basic Metabolic Panel: Recent Labs  Lab 04/01/18 0758 04/01/18 1121 04/02/18 0448 04/04/18 0718  NA 135  --  140 140  K 4.2  --  4.9 4.4  CL 100  --  105 106  CO2 27  --  29 25  GLUCOSE 127*  --  126* 101*  BUN 20  --  20 24*  CREATININE 1.02  --  1.08 0.97  CALCIUM 9.3  --  9.2 9.8  MG  --  2.0 2.0  --   PHOS  --   --  3.4  --    GFR: Estimated Creatinine Clearance: 81.4 mL/min (by C-G formula based on SCr of 0.97 mg/dL).   Liver Function Tests: Recent Labs  Lab 04/01/18 0758  AST 20  ALT 15  ALKPHOS 80  BILITOT 0.8  PROT 8.1  ALBUMIN 4.1   Recent Labs  Lab 04/01/18 0758  LIPASE 29   Urine analysis:    Component Value Date/Time   COLORURINE YELLOW 04/01/2018 0752   APPEARANCEUR CLEAR 04/01/2018 0752   LABSPEC 1.014 04/01/2018 0752   PHURINE 7.0 04/01/2018 0752   GLUCOSEU NEGATIVE 04/01/2018 0752   HGBUR NEGATIVE 04/01/2018 0752   BILIRUBINUR NEGATIVE 04/01/2018 0752   KETONESUR NEGATIVE 04/01/2018 0752   PROTEINUR NEGATIVE 04/01/2018 0752   UROBILINOGEN 0.2 10/21/2013 1930   NITRITE NEGATIVE 04/01/2018 0752   LEUKOCYTESUR NEGATIVE 04/01/2018 0752   Radiology Studies: Dg Chest 1 View  Result Date: 04/03/2018 CLINICAL DATA:  Cough. EXAM: CHEST  1 VIEW COMPARISON:  Radiographs of April 01, 2018. FINDINGS: Stable cardiomegaly. Mild central pulmonary vascular congestion is noted. Atherosclerosis of thoracic aorta is noted. Nasogastric tube is  seen entering stomach. No pneumothorax or pleural effusion is noted. No consolidative process is noted. Bony thorax is unremarkable. IMPRESSION: Stable cardiomegaly with central pulmonary vascular congestion. Aortic Atherosclerosis (ICD10-I70.0). Electronically Signed   By: Lupita Raider, M.D.   On: 04/03/2018 16:07   Dg Abd 2 Views  Result Date: 04/03/2018 CLINICAL DATA:  Small bowel obstruction. EXAM: ABDOMEN - 2 VIEW COMPARISON:  CT scan of April 01, 2018. FINDINGS: Nasogastric tube tip is seen in expected position of second portion of duodenum. No abnormal bowel dilatation is noted. No abnormal calcifications are noted. IMPRESSION: Nasogastric tube tip seen in expected position of second portion of duodenum. No definite evidence of bowel obstruction or ileus. Electronically Signed   By: Lupita Raider, M.D.   On: 04/03/2018 09:08    Scheduled Meds: . furosemide  40 mg Intravenous Daily  . heparin injection (subcutaneous)  5,000 Units Subcutaneous Q8H  . metoprolol tartrate  2.5 mg Intravenous Q8H  . pantoprazole (PROTONIX) IV  40 mg Intravenous Q24H   Continuous Infusions:    LOS:  3 days    Time spent: 25 minutes   Vassie Loll, MD [facility] Pager [phone]  If 7PM-7AM, please contact night-coverage [email or web address] Password [facility] 04/04/2018, 2:24 PM

## 2018-04-04 NOTE — Progress Notes (Signed)
[facility] Progress Note     Subjective: Remains distended. NG output dark bilious. Some minor flatus but not BM. Output recorded from NG is 2L.  Received some lasix given the pulmonary edema on CXR and cough patient had yesterday.   Objective: Vital signs in last 24 hours: Temp:  [97.3 F (36.3 C)-98.4 F (36.9 C)] 97.3 F (36.3 C) (12/15 0622) Pulse Rate:  [49-79] 79 (12/15 0622) Resp:  [18] 18 (12/14 2225) BP: (136-150)/(57-76) 150/76 (12/15 0622) SpO2:  [95 %-98 %] 95 % (12/15 0622) Last BM Date: 04/01/18  Intake/Output from previous day: 12/14 0701 - 12/15 0700 In: -  Out: 2550 [Urine:450; Emesis/NG output:2100] Intake/Output this shift: No intake/output data recorded.  General appearance: alert, cooperative and no distress Resp: normal work breathing GI: soft, tympanic, distended, non tender  Lab Results:  Recent Labs    04/02/18 0448  WBC 10.3  HGB 12.9*  HCT 41.7  PLT 265   BMET Recent Labs    04/02/18 0448 04/04/18 0718  NA 140 140  K 4.9 4.4  CL 105 106  CO2 29 25  GLUCOSE 126* 101*  BUN 20 24*  CREATININE 1.08 0.97  CALCIUM 9.2 9.8   PT/INR No results for input(s): LABPROT, INR in the last 72 hours.  Studies/Results: Dg Chest 1 View  Result Date: 04/03/2018 CLINICAL DATA:  Cough. EXAM: CHEST  1 VIEW COMPARISON:  Radiographs of April 01, 2018. FINDINGS: Stable cardiomegaly. Mild central pulmonary vascular congestion is noted. Atherosclerosis of thoracic aorta is noted. Nasogastric tube is seen entering stomach. No pneumothorax or pleural effusion is noted. No consolidative process is noted. Bony thorax is unremarkable. IMPRESSION: Stable cardiomegaly with central pulmonary vascular congestion. Aortic Atherosclerosis (ICD10-I70.0). Electronically Signed   By: Lupita Raider, M.D.   On: 04/03/2018 16:07   Dg Abd 2 Views  Result Date: 04/03/2018 CLINICAL DATA:  Small bowel obstruction. EXAM: ABDOMEN - 2 VIEW COMPARISON:   CT scan of April 01, 2018. FINDINGS: Nasogastric tube tip is seen in expected position of second portion of duodenum. No abnormal bowel dilatation is noted. No abnormal calcifications are noted. IMPRESSION: Nasogastric tube tip seen in expected position of second portion of duodenum. No definite evidence of bowel obstruction or ileus. Electronically Signed   By: Lupita Raider, M.D.   On: 04/03/2018 09:08    Assessment/Plan: Bryce Cook is a 77 yo with a SBO that is not resolving. Plan for SBFT tomorrow. Ordered for AM. If SBFT does not resolve the obstruction will plan for surgery Wednesday. PRN For pain Continue to ambulate and replace back to suction when in bed SBFT tomorrow    LOS: 3 days    Bryce Cook 04/04/2018

## 2018-04-05 ENCOUNTER — Inpatient Hospital Stay (HOSPITAL_COMMUNITY): Payer: Medicare PPO

## 2018-04-05 LAB — BASIC METABOLIC PANEL
Anion gap: 12 (ref 5–15)
BUN: 28 mg/dL — ABNORMAL HIGH (ref 8–23)
CALCIUM: 9.8 mg/dL (ref 8.9–10.3)
CHLORIDE: 105 mmol/L (ref 98–111)
CO2: 26 mmol/L (ref 22–32)
Creatinine, Ser: 1.05 mg/dL (ref 0.61–1.24)
GFR calc Af Amer: >60 mL/min (ref >60)
GFR calc non Af Amer: >60 mL/min (ref >60)
Glucose, Bld: 108 mg/dL — ABNORMAL HIGH (ref 70–99)
Potassium: 4.4 mmol/L (ref 3.5–5.1)
Sodium: 143 mmol/L (ref 135–145)

## 2018-04-05 LAB — MAGNESIUM: Magnesium: 2 mg/dL (ref 1.7–2.4)

## 2018-04-05 MED ORDER — IOPAMIDOL (ISOVUE-300) INJECTION 61%
INTRAVENOUS | Status: AC
  Administered 2018-04-05: 600 mL
  Filled 2018-04-05: qty 750

## 2018-04-05 MED ORDER — ACETAMINOPHEN 325 MG PO TABS
650.0000 mg | ORAL_TABLET | Freq: Four times a day (QID) | ORAL | Status: DC | PRN
  Administered 2018-04-05: 650 mg via ORAL
  Filled 2018-04-05: qty 2

## 2018-04-05 MED ORDER — ALFUZOSIN HCL ER 10 MG PO TB24
10.0000 mg | ORAL_TABLET | Freq: Every day | ORAL | Status: DC
  Administered 2018-04-06: 10 mg via ORAL
  Filled 2018-04-05 (×2): qty 1

## 2018-04-05 MED ORDER — CARVEDILOL 12.5 MG PO TABS
12.5000 mg | ORAL_TABLET | Freq: Two times a day (BID) | ORAL | Status: DC
  Administered 2018-04-05 – 2018-04-06 (×2): 12.5 mg via ORAL
  Filled 2018-04-05 (×2): qty 1

## 2018-04-05 MED ORDER — FUROSEMIDE 40 MG PO TABS
40.0000 mg | ORAL_TABLET | Freq: Every day | ORAL | Status: DC
  Administered 2018-04-05 – 2018-04-06 (×2): 40 mg via ORAL
  Filled 2018-04-05 (×2): qty 1

## 2018-04-05 NOTE — Progress Notes (Signed)
PROGRESS NOTE    Bryce Cook  KUV:[id number] DOB: 1940/05/18 DOA: 04/01/2018 PCP: Zachery Dauer, MD     Brief Narrative:  77 y.o. male with a past medical history significant for atrial fibrillation (chronically on Xarelto), systolic heart failure (last ejection fraction up from 45% to 60%); hypertension, hyperlipidemia, gout and prior history of small bowel obstruction; who presented to the hospital secondary to abdominal pain and nausea.  Patient reported having abdominal pain bloating sensation status approximately 12 hours prior to his arrival to the ED.  Patient expressed that his last bowel movement was earlier this morning (small and very hard; no blood, no mucus).  Patient not having any vomiting, but express associated nausea with ongoing abdominal pain. Pain is generalized, constant, aching in nature, 9 out of 10 in intensity, improved with pain medications in the ED, otherwise no significant alleviating factors.  Patient reports no fever, no chills, positive decreased appetite.   Patient also denies chest pain, shortness of breath, melena, hematochezia, dysuria, hematuria, headache, blurred vision, focal weakness.   Assessment & Plan: 1-SBO (small bowel obstruction) (HCC) -SBO resolving/resolved -Patient had large bowel movement, denies abdominal pain, no further nausea or vomiting. -NG tube has been removed follow recommendation by general surgery and after reviewing small bowel follow-through study. -Advance diet and to start transitioning medications to oral regimen.  2-Essential hypertension, benign -BP stable -Will transition to oral coreg. -also starting alfuzosin   3-Mixed hyperlipidemia -resume statins when fully tolerating oral regimen.    4-Atrial fibrillation (HCC) -continue rate control with oral beta-blockers at this time -Will continue holding Xarelto for tonight with anticipated resumption of anticoagulation in a.m.  5-systolic HF: chronic -stable and  overall compensated currently -after IVF's given initially, he developed mild vascular congestion; resolved now. -will resume home dose of lasix and b-blocker. -last EF improved and into 60's% range.  -Follow daily weights and strict intake and output.  6-morbid obesity -low calorie diet, portion control and exercise discussed with patient.  -Body mass index is 34.61 kg/m.  7-hx of gout -no acute flare appreciated.  -resume allopurinol when fully tolerating PO's  DVT prophylaxis: Heparin  Code Status: Full Family Communication: wife at bedside. All questions and concerns were addressed.   Disposition Plan: NG tube removed.  Will start transition medications to oral regimen and continue advancing diet.  Hopefully home in a.m.  Small bowel follow-through study demonstrated no obstruction currently.  Consultants:   General surgery   Procedures:   See below for x-ray reports    Antimicrobials:  Anti-infectives (From admission, onward)   None      Subjective: Patient denies nausea, vomiting, chest pain or shortness of breath.  NG tube has been removed and he had large bowel movement.  Objective: Vitals:   04/05/18 0839 04/05/18 0918 04/05/18 1455 04/05/18 1458  BP: (!) 160/76   121/69  Pulse: 63  87 (!) 47  Resp:    18  Temp:    98.5 F (36.9 C)  TempSrc:    Oral  SpO2:  97% 99% 98%  Weight:      Height:        Intake/Output Summary (Last 24 hours) at 04/05/2018 1717 Last data filed at 04/05/2018 0600 Gross per 24 hour  Intake -  Output 1750 ml  Net -1750 ml   Filed Weights   04/02/18 0500 04/03/18 0544 04/05/18 0508  Weight: 113.6 kg 112.6 kg 109.4 kg    Examination: General exam: Alert, awake, oriented  x 3; feeling much better.  Patient had a large bowel movement and denies any nausea vomiting.  NG tube has been removed.  So far tolerating clear liquid diet. Respiratory system: Clear to auscultation. Respiratory effort normal. Cardiovascular system:Rate  controlled. No murmurs, rubs, gallops. Gastrointestinal system: Abdomen is obese, soft, nontender to palpation.  With positive bowel sounds.  Still slightly distended, as per patient description..   Central nervous system: Alert and oriented. No focal neurological deficits. Extremities: No cyanosis, no clubbing, no edema. Skin: No rashes, lesions or ulcers Psychiatry: Judgement and insight appear normal. Mood & affect appropriate.   Data Reviewed: I have personally reviewed following labs and imaging studies  CBC: Recent Labs  Lab 04/01/18 0758 04/02/18 0448  WBC 12.6* 10.3  NEUTROABS 9.6*  --   HGB 13.7 12.9*  HCT 43.6 41.7  MCV 92.8 95.9  PLT 301 265   Basic Metabolic Panel: Recent Labs  Lab 04/01/18 0758 04/01/18 1121 04/02/18 0448 04/04/18 0718 04/05/18 0537  NA 135  --  140 140 143  K 4.2  --  4.9 4.4 4.4  CL 100  --  105 106 105  CO2 27  --  29 25 26   GLUCOSE 127*  --  126* 101* 108*  BUN 20  --  20 24* 28*  CREATININE 1.02  --  1.08 0.97 1.05  CALCIUM 9.3  --  9.2 9.8 9.8  MG  --  2.0 2.0  --  2.0  PHOS  --   --  3.4  --   --    GFR: Estimated Creatinine Clearance: 74.2 mL/min (by C-G formula based on SCr of 1.05 mg/dL).   Liver Function Tests: Recent Labs  Lab 04/01/18 0758  AST 20  ALT 15  ALKPHOS 80  BILITOT 0.8  PROT 8.1  ALBUMIN 4.1   Recent Labs  Lab 04/01/18 0758  LIPASE 29   Urine analysis:    Component Value Date/Time   COLORURINE YELLOW 04/01/2018 0752   APPEARANCEUR CLEAR 04/01/2018 0752   LABSPEC 1.014 04/01/2018 0752   PHURINE 7.0 04/01/2018 0752   GLUCOSEU NEGATIVE 04/01/2018 0752   HGBUR NEGATIVE 04/01/2018 0752   BILIRUBINUR NEGATIVE 04/01/2018 0752   KETONESUR NEGATIVE 04/01/2018 0752   PROTEINUR NEGATIVE 04/01/2018 0752   UROBILINOGEN 0.2 10/21/2013 1930   NITRITE NEGATIVE 04/01/2018 0752   LEUKOCYTESUR NEGATIVE 04/01/2018 0752   Radiology Studies: Dg Small Bowel  Result Date: 04/05/2018 CLINICAL DATA:   Small-bowel obstruction EXAM: SMALL BOWEL SERIES COMPARISON:  Abdominal radiographs 04/03/2018 and 04/02/2018, CT abdomen and pelvis 04/01/2018 TECHNIQUE: Following ingestion of thin barium, serial small bowel images were obtained including spot views of the terminal ileum. FLUOROSCOPY TIME:  Fluoroscopy Time:  1 minutes 6 seconds Radiation Exposure Index (if provided by the fluoroscopic device): 32.5 mGy Number of Acquired Spot Images: 6 plus multiple fluoroscopic screen captures FINDINGS: Tip of nasogastric tube projects over the distal second portion of the duodenum. Gas identified within air-filled nondilated loops of large and small bowel. Surgical clips in mid abdomen from prior ventral herniorrhaphy. Osseous structures unremarkable. 600 cc of a combination of Isovue-300 and tap water was injected via nasogastric tube. Small amount of contrast refluxed into the stomach. Small duodenal diverticulum noted. Rapid passage of contrast through duodenum, jejunum and ileum to the RIGHT colon by 30 minutes. No small bowel dilatation, wall thickening or stricture identified. Focal compression views of the terminal ileum and ascending colon are normal. Appendix not visualized, surgically absent by history.  IMPRESSION: Normal small bowel follow-through. Electronically Signed   By: Ulyses Southward M.D.   On: 04/05/2018 11:16    Scheduled Meds: . [START ON 04/06/2018] alfuzosin  10 mg Oral Q breakfast  . carvedilol  12.5 mg Oral BID WC  . furosemide  40 mg Oral Daily  . heparin injection (subcutaneous)  5,000 Units Subcutaneous Q8H  . pantoprazole (PROTONIX) IV  40 mg Intravenous Q24H   Continuous Infusions:    LOS: 4 days    Time spent: 25 minutes   Vassie Loll, MD [facility] Pager [phone]  If 7PM-7AM, please contact night-coverage [email or web address] Password [facility] 04/05/2018, 5:17 PM

## 2018-04-05 NOTE — Progress Notes (Signed)
[facility] Progress Note     Subjective: SBFT with transit to the colon. Feeling ok and had large BM. Ng removed by me.   Objective: Vital signs in last 24 hours: Temp:  [97.7 F (36.5 C)-98.2 F (36.8 C)] 98.2 F (36.8 C) (12/16 0508) Pulse Rate:  [55-82] 63 (12/16 0839) Resp:  [16-20] 20 (12/16 0508) BP: (147-160)/(68-81) 160/76 (12/16 0839) SpO2:  [94 %-98 %] 97 % (12/16 0918) Weight:  [109.4 kg] 109.4 kg (12/16 0508) Last BM Date: 04/01/18  Intake/Output from previous day: 12/15 0701 - 12/16 0700 In: -  Out: 2350 [Urine:1100; Emesis/NG output:1250] Intake/Output this shift: No intake/output data recorded.  General appearance: alert, cooperative and no distress Resp: normal work breathing GI: soft, distended, nontender  Lab Results:  No results for input(s): WBC, HGB, HCT, PLT in the last 72 hours. BMET Recent Labs    04/04/18 0718 04/05/18 0537  NA 140 143  K 4.4 4.4  CL 106 105  CO2 25 26  GLUCOSE 101* 108*  BUN 24* 28*  CREATININE 0.97 1.05  CALCIUM 9.8 9.8    Studies/Results: Dg Chest 1 View  Result Date: 04/03/2018 CLINICAL DATA:  Cough. EXAM: CHEST  1 VIEW COMPARISON:  Radiographs of April 01, 2018. FINDINGS: Stable cardiomegaly. Mild central pulmonary vascular congestion is noted. Atherosclerosis of thoracic aorta is noted. Nasogastric tube is seen entering stomach. No pneumothorax or pleural effusion is noted. No consolidative process is noted. Bony thorax is unremarkable. IMPRESSION: Stable cardiomegaly with central pulmonary vascular congestion. Aortic Atherosclerosis (ICD10-I70.0). Electronically Signed   By: Lupita Raider, M.D.   On: 04/03/2018 16:07    Assessment/Plan: Mr. Kingery is a 77 yo with resolving SBO. Doing better. Adv diet as tolerated, told patient to go slow Likely home tomorrow Follow up with PCP    LOS: 4 days    Lucretia Roers 04/05/2018

## 2018-04-05 NOTE — Care Management Important Message (Signed)
Important Message  Patient Details  Name: Bryce Cook MRN: [id number] Date of Birth: 08/15/1940   Medicare Important Message Given:  Yes    Renie Ora 04/05/2018, 11:20 AM

## 2018-04-06 DIAGNOSIS — E669 Obesity, unspecified: Secondary | ICD-10-CM

## 2018-04-06 DIAGNOSIS — I5022 Chronic systolic (congestive) heart failure: Secondary | ICD-10-CM

## 2018-04-06 LAB — CBC
HCT: 42.1 % (ref 39.0–52.0)
Hemoglobin: 13.5 g/dL (ref 13.0–17.0)
MCH: 29.6 pg (ref 26.0–34.0)
MCHC: 32.1 g/dL (ref 30.0–36.0)
MCV: 92.3 fL (ref 80.0–100.0)
PLATELETS: 285 10*3/uL (ref 150–400)
RBC: 4.56 MIL/uL (ref 4.22–5.81)
RDW: 14.9 % (ref 11.5–15.5)
WBC: 10.4 10*3/uL (ref 4.0–10.5)
nRBC: 0 % (ref 0.0–0.2)

## 2018-04-06 LAB — BASIC METABOLIC PANEL
Anion gap: 10 (ref 5–15)
BUN: 22 mg/dL (ref 8–23)
CO2: 25 mmol/L (ref 22–32)
Calcium: 9.2 mg/dL (ref 8.9–10.3)
Chloride: 102 mmol/L (ref 98–111)
Creatinine, Ser: 1.04 mg/dL (ref 0.61–1.24)
GFR calc Af Amer: >60 mL/min (ref >60)
GFR calc non Af Amer: >60 mL/min (ref >60)
Glucose, Bld: 133 mg/dL — ABNORMAL HIGH (ref 70–99)
Potassium: 3.5 mmol/L (ref 3.5–5.1)
Sodium: 137 mmol/L (ref 135–145)

## 2018-04-06 MED ORDER — ACETAMINOPHEN 325 MG PO TABS: 650.0000 mg | ORAL_TABLET | Freq: Four times a day (QID) | ORAL | 0 refills | Status: AC | PRN

## 2018-04-06 MED ORDER — PANTOPRAZOLE SODIUM 40 MG PO TBEC
40.0000 mg | DELAYED_RELEASE_TABLET | Freq: Every day | ORAL | Status: DC
  Administered 2018-04-06: 40 mg via ORAL
  Filled 2018-04-06: qty 1

## 2018-04-06 MED ORDER — POLYETHYLENE GLYCOL 3350 17 G PO PACK: 17.0000 g | PACK | Freq: Every day | ORAL | 1 refills | Status: AC | PRN

## 2018-04-06 NOTE — Progress Notes (Signed)
[facility] Progress Note     Subjective: Improving. Tolerating diet and multiple BMs.   Objective: Vital signs in last 24 hours: Temp:  [98.1 F (36.7 C)-99.1 F (37.3 C)] 98.1 F (36.7 C) (12/17 0650) Pulse Rate:  [47-98] 98 (12/17 0650) Resp:  [17-18] 18 (12/17 0650) BP: (101-121)/(69-84) 120/84 (12/17 0650) SpO2:  [93 %-99 %] 96 % (12/17 0650) Last BM Date: 04/01/18  Intake/Output from previous day: 12/16 0701 - 12/17 0700 In: 240 [P.O.:240] Out: 400 [Urine:400] Intake/Output this shift: Total I/O In: 240 [P.O.:240] Out: 650 [Urine:650]  General appearance: alert, cooperative and no distress Resp: normal work breathing GI: soft, distended, non tender  Lab Results:  Recent Labs    04/06/18 0435  WBC 10.4  HGB 13.5  HCT 42.1  PLT 285   BMET Recent Labs    04/05/18 0537 04/06/18 0435  NA 143 137  K 4.4 3.5  CL 105 102  CO2 26 25  GLUCOSE 108* 133*  BUN 28* 22  CREATININE 1.05 1.04  CALCIUM 9.8 9.2   PT/INR No results for input(s): LABPROT, INR in the last 72 hours.  Studies/Results: Dg Small Bowel  Result Date: 04/05/2018 CLINICAL DATA:  Small-bowel obstruction EXAM: SMALL BOWEL SERIES COMPARISON:  Abdominal radiographs 04/03/2018 and 04/02/2018, CT abdomen and pelvis 04/01/2018 TECHNIQUE: Following ingestion of thin barium, serial small bowel images were obtained including spot views of the terminal ileum. FLUOROSCOPY TIME:  Fluoroscopy Time:  1 minutes 6 seconds Radiation Exposure Index (if provided by the fluoroscopic device): 32.5 mGy Number of Acquired Spot Images: 6 plus multiple fluoroscopic screen captures FINDINGS: Tip of nasogastric tube projects over the distal second portion of the duodenum. Gas identified within air-filled nondilated loops of large and small bowel. Surgical clips in mid abdomen from prior ventral herniorrhaphy. Osseous structures unremarkable. 600 cc of a combination of Isovue-300 and tap water was injected  via nasogastric tube. Small amount of contrast refluxed into the stomach. Small duodenal diverticulum noted. Rapid passage of contrast through duodenum, jejunum and ileum to the RIGHT colon by 30 minutes. No small bowel dilatation, wall thickening or stricture identified. Focal compression views of the terminal ileum and ascending colon are normal. Appendix not visualized, surgically absent by history. IMPRESSION: Normal small bowel follow-through. Electronically Signed   By: Ulyses Southward M.D.   On: 04/05/2018 11:16    Assessment/Plan: Mr. Loner is a 77 yo with a resolving SBO. Doing better. Diet as tolerated Home and follow up with PCP   LOS: 5 days    Lucretia Roers 04/06/2018

## 2018-04-06 NOTE — Discharge Summary (Signed)
Physician Discharge Summary  Bryce Cook:[id number] DOB: [birth year] DOA: 04/01/2018  PCP: Zachery Dauer, MD  Admit date: 04/01/2018 Discharge date: 04/06/2018  Time spent: 35 minutes  Recommendations for Outpatient Follow-up:  1. Repeat basic metabolic panel to follow electrolytes and renal function. 2. Assist patient as much as possible with weight loss journey.   Discharge Diagnoses:  Principal Problem:   SBO (small bowel obstruction) (HCC) Active Problems:   Essential hypertension, benign   Mixed hyperlipidemia   Atrial fibrillation (HCC)   Chronic systolic HF (heart failure) (HCC)   Class 1 obesity   Discharge Condition: Stable and improved.  Patient discharged home with instruction to follow-up with PCP in 2 weeks.  Diet recommendation: Heart healthy diet  Filed Weights   04/02/18 0500 04/03/18 0544 04/05/18 0508  Weight: 113.6 kg 112.6 kg 109.4 kg    History of present illness:  77 y.o.malewith a past medical history significant for atrial fibrillation (chronically on Xarelto), systolic heart failure (last ejection fractionup from 45% to 60%);hypertension, hyperlipidemia, gout and prior history of small bowel obstruction; who presented to the hospital secondary to abdominal pain and nausea. Patient reported having abdominal pain bloating sensation status approximately 12 hours prior to his arrival to the ED. Patient expressed that his last bowel movement was earlier this morning (small and very hard; no blood, no mucus). Patient not having any vomiting, but express associated nausea with ongoing abdominal pain. Painis generalized, constant, aching in nature, 9 out of 10 in intensity, improved with pain medications in the ED,otherwise no significant alleviating factors. Patient reports no fever, no chills, positive decreased appetite.  Patient also denies chest pain, shortness of breath, melena, hematochezia, dysuria, hematuria, headache, blurred vision,  focal weakness.  Hospital Course:  1-SBO (small bowel obstruction) (HCC) -SBO resolved -diet advanced and well tolerated -advise to use miralax as needed to keep stools soft.  -Patient instructed to keep himself well-hydrated (adequate water intake).  2-Essential hypertension, benign -BP stable and well-controlled. -Resume home antihypertensive regimen -Patient advised to follow heart healthy diet.  3-Mixed hyperlipidemia -resume statins  -Advised to follow heart healthy diet.  4-Atrial fibrillation (HCC) -continue rate control with oral beta-blockers at this time -Will resume Xarelto for secondary prevention.  5-systolic HF: chronic -stable and overall compensated currently -after IVF's given initially, he developed mild vascular congestion; resolved now. -will resume home dose of lasix and b-blocker. -last EF improved and into 60's% range.  -Follow daily weights and low-sodium diet.  6-morbid obesity: Class I due to excess calorie intake. -low calorie diet, portion control and exercise discussed with patient.  -Body mass index is 34.61 kg/m.  7-hx of gout -no acute flare appreciated.  -resume allopurinol  Procedures:  See below for x-ray reports.  Consultations:  General surgery  Discharge Exam: Vitals:   04/06/18 0650 04/06/18 1434  BP: 120/84 (!) 151/79  Pulse: 98 92  Resp: 18 19  Temp: 98.1 F (36.7 C) 97.7 F (36.5 C)  SpO2: 96% 98%    General: Alert, awake and oriented x3; patient reports no acute distress and has continued having good bowel movements overnight.  No nausea, no vomiting, no abdominal pain.  Tolerating diet without problems. Cardiovascular: S1 and S2, no rubs, no gallops, no murmurs. Respiratory: Clear to auscultation bilaterally.  Respiratory effort normal. Abdomen: Obese, soft, nontender to palpation, positive bowel sounds in 4 quadrants.  No distention. Extremities: No edema, no cyanosis, no clubbing.  Discharge  Instructions   Discharge Instructions    (  HEART FAILURE PATIENTS) Call MD:  Anytime you have any of the following symptoms: 1) 3 pound weight gain in 24 hours or 5 pounds in 1 week 2) shortness of breath, with or without a dry hacking cough 3) swelling in the hands, feet or stomach 4) if you have to sleep on extra pillows at night in order to breathe.   Complete by:  As directed    Diet - low sodium heart healthy   Complete by:  As directed    Discharge instructions   Complete by:  As directed    Low residue diet and close observation to portion management Maintain adequate hydration (have to wait in ounces of water) As needed use of MiraLAX to maintain soft stools Arrange follow-up with PCP in 2 weeks     Allergies as of 04/06/2018      Reactions   Amiodarone Other (See Comments)   blister   Lipitor [atorvastatin] Other (See Comments)   "body tightness"  "couldn't get out of the bed"   Sotalol Rash      Medication List    STOP taking these medications   ibuprofen 800 MG tablet Commonly known as:  ADVIL,MOTRIN     TAKE these medications   acetaminophen 325 MG tablet Commonly known as:  TYLENOL Take 2 tablets (650 mg total) by mouth every 6 (six) hours as needed for mild pain, fever or headache.   alfuzosin 10 MG 24 hr tablet Commonly known as:  UROXATRAL Take 10 mg by mouth every evening.   allopurinol 100 MG tablet Commonly known as:  ZYLOPRIM Take 100 mg by mouth daily.   carvedilol 12.5 MG tablet Commonly known as:  COREG Take 12.5 mg by mouth 2 (two) times daily with a meal.   cetirizine 10 MG tablet Commonly known as:  ZYRTEC Take 10 mg by mouth daily as needed for allergies.   furosemide 40 MG tablet Commonly known as:  LASIX Take 40 mg by mouth daily.   lisinopril 20 MG tablet Commonly known as:  PRINIVIL,ZESTRIL Take 20 mg by mouth daily.   polyethylene glycol packet Commonly known as:  MIRALAX / GLYCOLAX Take 17 g by mouth daily as needed for  mild constipation.   rosuvastatin 10 MG tablet Commonly known as:  CRESTOR Take 10 mg by mouth daily.   XARELTO 20 MG Tabs tablet Generic drug:  rivaroxaban Take 1 tablet by mouth at bedtime.      Allergies  Allergen Reactions  . Amiodarone Other (See Comments)    blister  . Lipitor [Atorvastatin] Other (See Comments)    "body tightness"  "couldn't get out of the bed"  . Sotalol Rash   Follow-up Information    Zachery Dauer, MD. Schedule an appointment as soon as possible for a visit in 2 week(s).   Specialty:  Family Medicine Contact information: [address] [place] [address] [phone]           The results of significant diagnostics from this hospitalization (including imaging, microbiology, ancillary and laboratory) are listed below for reference.    Significant Diagnostic Studies: Dg Chest 1 View  Result Date: 04/03/2018 CLINICAL DATA:  Cough. EXAM: CHEST  1 VIEW COMPARISON:  Radiographs of April 01, 2018. FINDINGS: Stable cardiomegaly. Mild central pulmonary vascular congestion is noted. Atherosclerosis of thoracic aorta is noted. Nasogastric tube is seen entering stomach. No pneumothorax or pleural effusion is noted. No consolidative process is noted. Bony thorax is unremarkable. IMPRESSION: Stable cardiomegaly  with central pulmonary vascular congestion. Aortic Atherosclerosis (ICD10-I70.0). Electronically Signed   By: Lupita Raider, M.D.   On: 04/03/2018 16:07   Ct Abdomen Pelvis W Contrast  Result Date: 04/01/2018 CLINICAL DATA:  Abdominal pain and constipation EXAM: CT ABDOMEN AND PELVIS WITH CONTRAST TECHNIQUE: Multidetector CT imaging of the abdomen and pelvis was performed using the standard protocol following bolus administration of intravenous contrast. CONTRAST:  ISOVUE-300 IOPAMIDOL (ISOVUE-300) INJECTION 61% COMPARISON:  November 02, 2015 FINDINGS: Lower chest: There is bibasilar atelectatic change. There are foci of coronary  artery calcification. Hepatobiliary: There is hepatic steatosis. No focal liver lesions are evident. Gallbladder wall is not appreciably thickened. There is no biliary duct dilatation. Pancreas: No pancreatic mass or inflammatory focus. Spleen: No splenic lesions are evident. Adrenals/Urinary Tract: Adrenals bilaterally appear normal. There is no evident renal mass or hydronephrosis on either side. There is moderate renal sinus fat bilaterally, a finding of questionable clinical significance. There is no appreciable renal or ureteral calculus on either side. Urinary bladder is midline with wall thickness within normal limits. Stomach/Bowel: There are multiple descending colonic and sigmoid diverticula without diverticulitis. There is mild gastric distention with fluid throughout the stomach. There are loops of dilated jejunum and proximal ileum. There is a relative transition zone in the proximal ileal region, felt to be indicative of a degree bowel obstruction. Most small bowel loops contain fluid, although there are scattered foci of air within small bowel loops. There is slight fluid adjacent to bowel in the mid abdomen just to the left of midline, likely due to inflammation secondary to developing bowel obstruction. No free air or portal venous air is demonstrable on this study. Mild mesenteric thickening is noted in the mid abdomen as well, likely due to developing bowel obstruction. There is no intramural air. Vascular/Lymphatic: There is aortic and iliac artery atherosclerosis. No aneurysm evident. There is calcification in the proximal major mesenteric arteries. No major mesenteric arterial obstruction is demonstrated on this study. There is no evident adenopathy in the abdomen or pelvis. Reproductive: There are prostatic calculi. Prostate and seminal vesicles are normal in size and contour. No pelvic mass evident. Other: Appendix absent. No periappendiceal region inflammation. There is no abscess in the  abdomen or pelvis. There is no free-flowing ascites in the abdomen or pelvis. There is a small ventral hernia containing only fat. There is mesh along the anterior abdominal wall. Musculoskeletal: There are foci of degenerative change in the lower thoracic and lumbar spine regions. Patient is status post total hip replacement on the left. No blastic or lytic bone lesions. There is no intramuscular lesion. IMPRESSION: 1. There is felt to be a degree of small bowel obstruction with transition zone at the proximal ileal level. There is mild mid abdominal loculated fluid mesenteric thickening, felt to be changes secondary to developing small bowel obstruction. No free air evident. 2. No abscess in the abdomen or pelvis. Appendix absent. No periappendiceal region inflammation. 3. Extensive left-sided diverticulosis without diverticulitis. No colonic obstruction evident. 4. 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 No major vessel occlusion. Note that there is no portal venous air or intramural air involving bowel to suggest bowel ischemia. There also foci of coronary artery calcification noted. 5. Postoperative change anterior abdominal wall. Small ventral hernia currently containing only fat. 6.  Status post left total hip replacement. Aortic Atherosclerosis (ICD10-I70.0). Electronically Signed   By: Bretta Bang III M.D.   On: 04/01/2018 09:35   Dg Small Bowel  Result Date: 04/05/2018 CLINICAL DATA:  Small-bowel obstruction EXAM: SMALL BOWEL SERIES COMPARISON:  Abdominal radiographs 04/03/2018 and 04/02/2018, CT abdomen and pelvis 04/01/2018 TECHNIQUE: Following ingestion of thin barium, serial small bowel images were obtained including spot views of the terminal ileum. FLUOROSCOPY TIME:  Fluoroscopy Time:  1 minutes 6 seconds Radiation Exposure Index (if provided by the fluoroscopic device): 32.5 mGy Number of Acquired Spot Images: 6 plus multiple fluoroscopic screen captures  FINDINGS: Tip of nasogastric tube projects over the distal second portion of the duodenum. Gas identified within air-filled nondilated loops of large and small bowel. Surgical clips in mid abdomen from prior ventral herniorrhaphy. Osseous structures unremarkable. 600 cc of a combination of Isovue-300 and tap water was injected via nasogastric tube. Small amount of contrast refluxed into the stomach. Small duodenal diverticulum noted. Rapid passage of contrast through duodenum, jejunum and ileum to the RIGHT colon by 30 minutes. No small bowel dilatation, wall thickening or stricture identified. Focal compression views of the terminal ileum and ascending colon are normal. Appendix not visualized, surgically absent by history. IMPRESSION: Normal small bowel follow-through. Electronically Signed   By: Ulyses Southward M.D.   On: 04/05/2018 11:16   Dg Chest Portable 1 View  Result Date: 04/01/2018 CLINICAL DATA:  Nasogastric tube placement EXAM: PORTABLE CHEST 1 VIEW COMPARISON:  Chest radiograph 10/21/2013 FINDINGS: Nonvisualization of the nasogastric tube below about the level of the carina. This is mostly due to poor penetration. Coiled tubing noted over the left upper quadrant but this may represent part of the tube outside of the patient. Indistinct pulmonary vasculature. Indistinct left heart border. Suspected cardiomegaly. IMPRESSION: 1. Nasogastric tube is not visualized below about the level of the carina. This lack of visualization could be due to technical factors and lack of penetration rather than the tube actually being stuck in the mid esophagus or at the carina. Correlation with any new pulmonary symptoms is clearly warranted. Repeat radiography is recommended. 2. Cardiomegaly with indistinct pulmonary vasculature favoring pulmonary venous hypertension. These results will be called to the ordering clinician or representative by the Radiologist Assistant, and communication documented in the PACS or  [facility] Dashboard. Electronically Signed   By: Gaylyn Rong M.D.   On: 04/01/2018 14:00   Dg Chest Port 1v Same Day  Result Date: 04/01/2018 CLINICAL DATA:  Nasogastric tube placement EXAM: PORTABLE CHEST 1 VIEW COMPARISON:  Repeat portable exam at 1547 hrs compared to earlier studies of 04/01/2018 FINDINGS: Nasogastric tube traverses thoracic esophagus and is coiled within the stomach with the tip at the distal gastric antrum. This could be withdrawn 15 cm and still have sufficient tube within the stomach. Enlargement of cardiac silhouette. Mediastinal contours normal. Atherosclerotic calcifications aorta. Mild bibasilar atelectasis without infiltrate or pleural effusion. No pneumothorax. IMPRESSION: Bibasilar atelectasis. Enlargement of cardiac silhouette. Nasogastric tube within stomach with tip at distal gastric antrum; this could be partially withdrawn approximately 15 cm and still have sufficient tube length within the stomach. Electronically Signed   By: Ulyses Southward M.D.   On: 04/01/2018 16:10   Dg Chest Port 1v Same Day  Result Date: 04/01/2018 CLINICAL DATA:  NG tube placement EXAM: PORTABLE CHEST 1 VIEW COMPARISON:  Portable chest x-ray from earlier today FINDINGS: The films very light in technique. However, the tip of the endotracheal tube is seen only to the midthoracic esophagus. The lungs are not well aerated. Moderate cardiomegaly is stable. Mild pulmonary vascular congestion and not be excluded. IMPRESSION: 1. NG tube tip seen only  to the midthoracic esophagus, with the film being very light in technique. 2. Stable cardiomegaly. Can not exclude mild pulmonary vascular congestion. Electronically Signed   By: Dwyane Dee M.D.   On: 04/01/2018 15:01   Dg Abd 2 Views  Result Date: 04/03/2018 CLINICAL DATA:  Small bowel obstruction. EXAM: ABDOMEN - 2 VIEW COMPARISON:  CT scan of April 01, 2018. FINDINGS: Nasogastric tube tip is seen in expected position of second portion of  duodenum. No abnormal bowel dilatation is noted. No abnormal calcifications are noted. IMPRESSION: Nasogastric tube tip seen in expected position of second portion of duodenum. No definite evidence of bowel obstruction or ileus. Electronically Signed   By: Lupita Raider, M.D.   On: 04/03/2018 09:08   Dg Abd Portable 1v  Result Date: 04/02/2018 CLINICAL DATA:  NG tube placement. EXAM: PORTABLE ABDOMEN - 1 VIEW COMPARISON:  CT scan 04/01/2018 FINDINGS: The NG tube tip is in the antropyloric region of the stomach. The lung bases are grossly clear. Mild cardiac enlargement. IMPRESSION: NG tube in good position with the tip in the antropyloric region of the stomach. Electronically Signed   By: Rudie Meyer M.D.   On: 04/02/2018 07:52   Labs: Basic Metabolic Panel: Recent Labs  Lab 04/01/18 0758 04/01/18 1121 04/02/18 0448 04/04/18 0718 04/05/18 0537 04/06/18 0435  NA 135  --  140 140 143 137  K 4.2  --  4.9 4.4 4.4 3.5  CL 100  --  105 106 105 102  CO2 27  --  29 25 26 25   GLUCOSE 127*  --  126* 101* 108* 133*  BUN 20  --  20 24* 28* 22  CREATININE 1.02  --  1.08 0.97 1.05 1.04  CALCIUM 9.3  --  9.2 9.8 9.8 9.2  MG  --  2.0 2.0  --  2.0  --   PHOS  --   --  3.4  --   --   --    Liver Function Tests: Recent Labs  Lab 04/01/18 0758  AST 20  ALT 15  ALKPHOS 80  BILITOT 0.8  PROT 8.1  ALBUMIN 4.1   Recent Labs  Lab 04/01/18 0758  LIPASE 29   CBC: Recent Labs  Lab 04/01/18 0758 04/02/18 0448 04/06/18 0435  WBC 12.6* 10.3 10.4  NEUTROABS 9.6*  --   --   HGB 13.7 12.9* 13.5  HCT 43.6 41.7 42.1  MCV 92.8 95.9 92.3  PLT 301 265 285    CBG: Recent Labs  Lab 04/02/18 2045  GLUCAP 98    Signed:  Vassie Loll MD.  [facility] 04/06/2018, 3:02 PM

## 2018-04-06 NOTE — Progress Notes (Signed)
Bryce Cook discharged Home per MD order.  Discharge instructions reviewed and discussed with the patient, all questions and concerns answered. Copy of instructions and scripts given to patient.  Allergies as of 04/06/2018      Reactions   Amiodarone Other (See Comments)   blister   Lipitor [atorvastatin] Other (See Comments)   "body tightness"  "couldn't get out of the bed"   Sotalol Rash      Medication List    STOP taking these medications   ibuprofen 800 MG tablet Commonly known as:  ADVIL,MOTRIN     TAKE these medications   acetaminophen 325 MG tablet Commonly known as:  TYLENOL Take 2 tablets (650 mg total) by mouth every 6 (six) hours as needed for mild pain, fever or headache.   alfuzosin 10 MG 24 hr tablet Commonly known as:  UROXATRAL Take 10 mg by mouth every evening.   allopurinol 100 MG tablet Commonly known as:  ZYLOPRIM Take 100 mg by mouth daily.   carvedilol 12.5 MG tablet Commonly known as:  COREG Take 12.5 mg by mouth 2 (two) times daily with a meal.   cetirizine 10 MG tablet Commonly known as:  ZYRTEC Take 10 mg by mouth daily as needed for allergies.   furosemide 40 MG tablet Commonly known as:  LASIX Take 40 mg by mouth daily.   lisinopril 20 MG tablet Commonly known as:  PRINIVIL,ZESTRIL Take 20 mg by mouth daily.   polyethylene glycol packet Commonly known as:  MIRALAX / GLYCOLAX Take 17 g by mouth daily as needed for mild constipation.   rosuvastatin 10 MG tablet Commonly known as:  CRESTOR Take 10 mg by mouth daily.   XARELTO 20 MG Tabs tablet Generic drug:  rivaroxaban Take 1 tablet by mouth at bedtime.       Patients skin is clean, dry and intact, no evidence of skin break down. IV site discontinued and catheter remains intact. Site without signs and symptoms of complications. Dressing and pressure applied.  Patient escorted to car in a wheelchair,  no distress noted upon discharge.  Bryce Cook 04/06/2018 4:48 PM

## 2020-10-07 ENCOUNTER — Inpatient Hospital Stay (HOSPITAL_COMMUNITY)
Admission: EM | Admit: 2020-10-07 | Discharge: 2020-10-11 | DRG: 389 | Disposition: A | Discharge status: Home / Self Care | Payer: Medicare PPO | Attending: Internal Medicine | Admitting: Internal Medicine

## 2020-10-07 ENCOUNTER — Encounter (HOSPITAL_COMMUNITY): Payer: Self-pay | Admitting: *Deleted

## 2020-10-07 ENCOUNTER — Inpatient Hospital Stay (HOSPITAL_COMMUNITY): Payer: Medicare PPO

## 2020-10-07 ENCOUNTER — Emergency Department (HOSPITAL_COMMUNITY): Payer: Medicare PPO

## 2020-10-07 ENCOUNTER — Other Ambulatory Visit: Payer: Self-pay

## 2020-10-07 DIAGNOSIS — Z83438 Family history of other disorder of lipoprotein metabolism and other lipidemia: Secondary | ICD-10-CM | POA: Diagnosis not present

## 2020-10-07 DIAGNOSIS — Z96642 Presence of left artificial hip joint: Secondary | ICD-10-CM | POA: Diagnosis present

## 2020-10-07 DIAGNOSIS — Z8719 Personal history of other diseases of the digestive system: Secondary | ICD-10-CM | POA: Diagnosis not present

## 2020-10-07 DIAGNOSIS — Z6835 Body mass index (BMI) 35.0-35.9, adult: Secondary | ICD-10-CM

## 2020-10-07 DIAGNOSIS — M109 Gout, unspecified: Secondary | ICD-10-CM | POA: Diagnosis present

## 2020-10-07 DIAGNOSIS — I5022 Chronic systolic (congestive) heart failure: Secondary | ICD-10-CM | POA: Diagnosis not present

## 2020-10-07 DIAGNOSIS — Z4659 Encounter for fitting and adjustment of other gastrointestinal appliance and device: Secondary | ICD-10-CM

## 2020-10-07 DIAGNOSIS — K56609 Unspecified intestinal obstruction, unspecified as to partial versus complete obstruction: Secondary | ICD-10-CM

## 2020-10-07 DIAGNOSIS — I10 Essential (primary) hypertension: Secondary | ICD-10-CM

## 2020-10-07 DIAGNOSIS — K5651 Intestinal adhesions [bands], with partial obstruction: Principal | ICD-10-CM | POA: Diagnosis present

## 2020-10-07 DIAGNOSIS — E782 Mixed hyperlipidemia: Secondary | ICD-10-CM

## 2020-10-07 DIAGNOSIS — I4819 Other persistent atrial fibrillation: Secondary | ICD-10-CM | POA: Diagnosis not present

## 2020-10-07 DIAGNOSIS — R109 Unspecified abdominal pain: Secondary | ICD-10-CM | POA: Diagnosis present

## 2020-10-07 DIAGNOSIS — I110 Hypertensive heart disease with heart failure: Secondary | ICD-10-CM | POA: Diagnosis present

## 2020-10-07 DIAGNOSIS — Z0189 Encounter for other specified special examinations: Secondary | ICD-10-CM

## 2020-10-07 DIAGNOSIS — R739 Hyperglycemia, unspecified: Secondary | ICD-10-CM

## 2020-10-07 DIAGNOSIS — Z8249 Family history of ischemic heart disease and other diseases of the circulatory system: Secondary | ICD-10-CM

## 2020-10-07 DIAGNOSIS — I4891 Unspecified atrial fibrillation: Secondary | ICD-10-CM | POA: Diagnosis present

## 2020-10-07 DIAGNOSIS — Z79899 Other long term (current) drug therapy: Secondary | ICD-10-CM

## 2020-10-07 DIAGNOSIS — D72829 Elevated white blood cell count, unspecified: Secondary | ICD-10-CM | POA: Diagnosis present

## 2020-10-07 DIAGNOSIS — E1165 Type 2 diabetes mellitus with hyperglycemia: Secondary | ICD-10-CM | POA: Diagnosis present

## 2020-10-07 DIAGNOSIS — I482 Chronic atrial fibrillation, unspecified: Secondary | ICD-10-CM | POA: Diagnosis present

## 2020-10-07 DIAGNOSIS — Z87891 Personal history of nicotine dependence: Secondary | ICD-10-CM | POA: Diagnosis not present

## 2020-10-07 DIAGNOSIS — E785 Hyperlipidemia, unspecified: Secondary | ICD-10-CM | POA: Diagnosis present

## 2020-10-07 DIAGNOSIS — Z7901 Long term (current) use of anticoagulants: Secondary | ICD-10-CM

## 2020-10-07 DIAGNOSIS — Z20822 Contact with and (suspected) exposure to covid-19: Secondary | ICD-10-CM | POA: Diagnosis present

## 2020-10-07 DIAGNOSIS — K56600 Partial intestinal obstruction, unspecified as to cause: Secondary | ICD-10-CM | POA: Diagnosis present

## 2020-10-07 DIAGNOSIS — I429 Cardiomyopathy, unspecified: Secondary | ICD-10-CM | POA: Diagnosis present

## 2020-10-07 DIAGNOSIS — G4733 Obstructive sleep apnea (adult) (pediatric): Secondary | ICD-10-CM

## 2020-10-07 DIAGNOSIS — Z888 Allergy status to other drugs, medicaments and biological substances status: Secondary | ICD-10-CM | POA: Diagnosis not present

## 2020-10-07 LAB — CBC
HCT: 44.8 % (ref 39.0–52.0)
Hemoglobin: 14.5 g/dL (ref 13.0–17.0)
MCH: 29.8 pg (ref 26.0–34.0)
MCHC: 32.4 g/dL (ref 30.0–36.0)
MCV: 92.2 fL (ref 80.0–100.0)
Platelets: 287 10*3/uL (ref 150–400)
RBC: 4.86 MIL/uL (ref 4.22–5.81)
RDW: 16.2 % — ABNORMAL HIGH (ref 11.5–15.5)
WBC: 13.6 10*3/uL — ABNORMAL HIGH (ref 4.0–10.5)
nRBC: 0 % (ref 0.0–0.2)

## 2020-10-07 LAB — COMPREHENSIVE METABOLIC PANEL
ALT: 44 U/L (ref 0–44)
AST: 29 U/L (ref 15–41)
Albumin: 3.4 g/dL — ABNORMAL LOW (ref 3.5–5.0)
Alkaline Phosphatase: 93 U/L (ref 38–126)
Anion gap: 11 (ref 5–15)
BUN: 23 mg/dL (ref 8–23)
CO2: 27 mmol/L (ref 22–32)
Calcium: 9 mg/dL (ref 8.9–10.3)
Chloride: 100 mmol/L (ref 98–111)
Creatinine, Ser: 0.85 mg/dL (ref 0.61–1.24)
GFR, Estimated: >60 mL/min (ref >60)
Glucose, Bld: 138 mg/dL — ABNORMAL HIGH (ref 70–99)
Potassium: 3.8 mmol/L (ref 3.5–5.1)
Sodium: 138 mmol/L (ref 135–145)
Total Bilirubin: 0.7 mg/dL (ref 0.3–1.2)
Total Protein: 7.4 g/dL (ref 6.5–8.1)

## 2020-10-07 LAB — LIPASE, BLOOD: Lipase: 31 U/L (ref 11–51)

## 2020-10-07 MED ORDER — ONDANSETRON HCL 4 MG/2ML IJ SOLN
4.0000 mg | Freq: Once | INTRAMUSCULAR | Status: AC
  Administered 2020-10-07: 4 mg via INTRAVENOUS
  Filled 2020-10-07: qty 2

## 2020-10-07 MED ORDER — MORPHINE SULFATE (PF) 4 MG/ML IV SOLN
4.0000 mg | Freq: Once | INTRAVENOUS | Status: AC
  Administered 2020-10-07: 4 mg via INTRAVENOUS
  Filled 2020-10-07: qty 1

## 2020-10-07 NOTE — ED Provider Notes (Signed)
[facility] EMERGENCY DEPARTMENT Provider Note   CSN: [id number] Arrival date & time: 10/07/20  1636     History Chief Complaint  Patient presents with   Abdominal Pain    Bryce Cook is a 79 y.o. male.  HPI  Patient with significant medical history of A. fib, abdominal hernia, cardiomyopathy, hypertension, small bowel obstruction on anticoagulant resents with chief complaint of abdominal pain.  Patient states pain started last night, came on suddenly, describes it as a pressure-like sensation which comes and goes.  Patient states he is unable to sleep due to the pain, states he has no appetite, he has felt slightly nauseous and a single episode of vomiting, he denies hematemesis.  Patient states he has been able have bowel movements denies hematochezia but has been unable to pass any flatus, states that he feels like his stomach is much larger than usual.  He endorses that he feels similar to previous bowel obstructions.  He denies any urinary symptoms, fevers, chills, chest pain or shortness of breath.  He denies any alleviating factors.  After reviewing patient's chart patient has had numerous small bowel obstructions, previous one was 2019 and resolved without surgery.  Past Medical History:  Diagnosis Date   A-fib [facility])    Abdominal hernia    Arthritis    Cardiomyopathy (HCC)    LVEF of 40-45% 2005, LVEF 60% 2013   Essential hypertension, benign    Gout    Hyperlipidemia    Small bowel obstruction [facility])     Patient Active Problem List   Diagnosis Date Noted   Chronic systolic HF (heart failure) (HCC)    Class 1 obesity    Intestinal adhesions with partial obstruction (HCC)    Abdominal pain 11/03/2015   Partial small bowel obstruction (HCC) 11/03/2015   A-fib (HCC)    AP (abdominal pain)    Atrial fibrillation (HCC)    SBO (small bowel obstruction) (HCC) 10/21/2013   Enteritis 10/21/2013   Small bowel obstruction 07/03/2013   Cardiomyopathy, secondary (HCC)  01/01/2012   Essential hypertension, benign 01/01/2012   Mixed hyperlipidemia 01/01/2012    Past Surgical History:  Procedure Laterality Date   APPENDECTOMY     EYE SURGERY     VENTRAL HERNIA REPAIR         Family History  Problem Relation Age of Onset   Heart failure Mother    Hypertension Mother    Hyperlipidemia Mother    Coronary artery disease Father        Died age 40 with MI   Hypertension Father    Hyperlipidemia Father     Social History   Tobacco Use   Smoking status: Former    Pack years: 0.00    Types: Cigarettes    Quit date: 04/21/1977    Years since quitting: 43.4   Smokeless tobacco: Never  Vaping Use   Vaping Use: Never used  Substance Use Topics   Alcohol use: Yes    Comment: Occasionally   Drug use: No    Home Medications Prior to Admission medications   Medication Sig Start Date End Date Taking? Authorizing Provider  acetaminophen (TYLENOL) 325 MG tablet Take 2 tablets (650 mg total) by mouth every 6 (six) hours as needed for mild pain, fever or headache. 04/06/18  Yes Vassie Loll, MD  allopurinol (ZYLOPRIM) 100 MG tablet Take 100 mg by mouth daily.  11/17/11  Yes [provider]  carvedilol (COREG) 25 MG tablet Take 25 mg by  mouth daily. 09/12/20  Yes [provider]  cetirizine (ZYRTEC) 10 MG tablet Take 10 mg by mouth daily as needed for allergies.    Yes [provider]  furosemide (LASIX) 80 MG tablet Take 80 mg by mouth daily. 04/25/20  Yes [provider]  lisinopril (PRINIVIL,ZESTRIL) 20 MG tablet Take 20 mg by mouth daily.   Yes [provider]  Nintedanib (OFEV) 150 MG CAPS Take 150 mg by mouth 2 (two) times daily. 07/13/20  Yes [provider]  polyethylene glycol (MIRALAX / GLYCOLAX) packet Take 17 g by mouth daily as needed for mild constipation. 04/06/18  Yes Vassie Loll, MD  rivaroxaban (XARELTO) 20 MG TABS tablet Take 1 tablet by mouth at bedtime. 09/29/16  Yes [provider]  rosuvastatin (CRESTOR) 10 MG tablet Take 10 mg by mouth daily.   Yes [provider]    Allergies    Amiodarone, Lipitor [atorvastatin], and Sotalol  Review of Systems   Review of Systems  Constitutional:  Negative for chills and fever.  HENT:  Negative for congestion.   Respiratory:  Negative for shortness of breath.   Cardiovascular:  Negative for chest pain.  Gastrointestinal:  Positive for abdominal pain, nausea and vomiting. Negative for constipation and diarrhea.  Genitourinary:  Negative for enuresis.  Musculoskeletal:  Negative for back pain.  Skin:  Negative for rash.  Neurological:  Negative for headaches.  Hematological:  Does not bruise/bleed easily.   Physical Exam Updated Vital Signs BP 129/72   Pulse 87   Temp 97.9 F (36.6 C) (Oral)   Resp 18   SpO2 95%   Physical Exam Vitals and nursing note reviewed.  Constitutional:      General: He is not in acute distress.    Appearance: He is not ill-appearing.  HENT:     Head: Normocephalic and atraumatic.     Nose: No congestion.  Eyes:     Conjunctiva/sclera: Conjunctivae normal.  Cardiovascular:     Rate and Rhythm: Normal rate and regular rhythm.     Pulses: Normal pulses.     Heart sounds: No murmur heard.   No friction rub. No gallop.  Pulmonary:     Effort: No respiratory distress.     Breath sounds: No wheezing, rhonchi or rales.  Abdominal:     General: There is distension.     Palpations: Abdomen is soft.     Tenderness: There is abdominal tenderness. There is no right CVA tenderness or left CVA tenderness.     Comments: Abdomen was visualized it is distended, tympanic to percussion, normal active bowel sounds, he had generalized abdominal pain, no focal tenderness, negative guarding, rebound tenderness, peritoneal sign.  Musculoskeletal:     Right lower leg: No edema.     Left lower leg: No edema.  Skin:    General: Skin is warm and dry.  Neurological:     Mental  Status: He is alert.  Psychiatric:        Mood and Affect: Mood normal.    ED Results / Procedures / Treatments   Labs (all labs ordered are listed, but only abnormal results are displayed) Labs Reviewed  COMPREHENSIVE METABOLIC PANEL - Abnormal; Notable for the following components:      Result Value   Glucose, Bld 138 (*)    Albumin 3.4 (*)    All other components within normal limits  CBC - Abnormal; Notable for the following components:   WBC 13.6 (*)  RDW 16.2 (*)    All other components within normal limits  SARS CORONAVIRUS 2 (TAT 6-24 HRS)  LIPASE, BLOOD    EKG None  Radiology CT ABDOMEN PELVIS WO CONTRAST  Result Date: 10/07/2020 CLINICAL DATA:  Abdominal distension for 2 days EXAM: CT ABDOMEN AND PELVIS WITHOUT CONTRAST TECHNIQUE: Multidetector CT imaging of the abdomen and pelvis was performed following the standard protocol without IV contrast. COMPARISON:  04/01/2018 FINDINGS: Lower chest: Progressive scarring is noted in the lower lobes bilaterally. No sizable effusion or focal confluent infiltrate is seen. Defibrillator is noted. Hepatobiliary: No focal liver abnormality is seen. No gallstones, gallbladder wall thickening, or biliary dilatation. Pancreas: Unremarkable. No pancreatic ductal dilatation or surrounding inflammatory changes. Spleen: Normal in size without focal abnormality. Adrenals/Urinary Tract: Adrenal glands are within normal limits. Kidneys are well visualized bilaterally. No renal calculi or obstructive changes are seen. The ureters are within normal limits. Bladder is well distended. Stomach/Bowel: Diverticular change of the colon is noted without significant diverticulitis. No obstructive changes are seen. The appendix has been surgically removed. Small bowel demonstrates mild proximal dilatation consistent with partial small bowel obstruction. Some fecalization of small bowel contents is noted. These loops of jejunum and likely proximal ileum are  dilated to a an area of adhesion intimately related to the posterior aspect of the anterior abdominal wall where prior hernia repair was performed. The more distal small bowel is within normal limits. Transition zone is best seen on the coronal imaging image 21 of series 5 and the axial image number 68 of series 2. The more distal small bowel is within normal limits. Vascular/Lymphatic: Aortic atherosclerosis. No enlarged abdominal or pelvic lymph nodes. Reproductive: Prostate is unremarkable. Other: No abdominal wall hernia or abnormality. No abdominopelvic ascites. Musculoskeletal: Left hip prosthesis is noted. Degenerative changes of lumbar spine are seen. IMPRESSION: Changes consistent with partial small bowel obstruction in the proximal ileum secondary to adhesions from prior ventral hernia repair. Progressive scarring in the lung bases bilaterally. No acute infiltrate is seen. Electronically Signed   By: Alcide Clever M.D.   On: 10/07/2020 21:54    Procedures Procedures   Medications Ordered in ED Medications  morphine 4 MG/ML injection 4 mg (4 mg Intravenous Given 10/07/20 2050)  ondansetron (ZOFRAN) injection 4 mg (4 mg Intravenous Given 10/07/20 2103)    ED Course  I have reviewed the triage vital signs and the nursing notes.  Pertinent labs & imaging results that were available during my care of the patient were reviewed by me and considered in my medical decision making (see chart for details).    MDM Rules/Calculators/A&P                         Initial impression-patient presents with stomach pain nausea and vomiting.  He is alert, does not appear in acute stress, vital signs reassuring.  High clinical suspicion for bowel obstruction.  Will obtain basic lab work-up, obtain CT imaging for further evaluation.  Work-up-CBC shows leukocytosis of 13.6, CMP shows slight hyperglycemia 138, lipase 31, CT abdomen pelvis reveals changes consistent with a partial small bowel obstruction in the  proximal ileum secondary due to adhesions from prior ventral hernia.  Progression of scarring in the lung bases bilaterally.  Reassessment-updated patient on lab work and imaging, patient has no complaints this time, he is agreeable for admission.  Will consult with general surgery for further recommendations.  Consult-spoke with Dr. Lovell Sheehan he recommends NG tube placement,  and he will consult on the patient tomorrow morning, admit to medicine  Dr. Thomes Dinning will admit the patient   Rule out-I have low suspicion for systemic infection as patient is nontoxic-appearing, vital signs reassuring.  Patient does have leukocytosis but I suspect this is an acute phase reactant secondary due to bowel obstruction.  Low suspicion for pancreatitis as lipase is within normal limits, patient has low risk factors.  Low suspicion for ruptured stomach ulcer as no pneumoperitoneum seen on CT abdomen pelvis.  Plan-admit to medicine due to small bowel obstruction.   Final Clinical Impression(s) / ED Diagnoses Final diagnoses:  Intestinal obstruction, unspecified cause, unspecified whether partial or complete [facility])    Rx / DC Orders ED Discharge Orders     None        Carroll Sage, PA-C 10/08/20 0009    Mancel Bale, MD 10/08/20 1154

## 2020-10-07 NOTE — ED Triage Notes (Signed)
Abdominal pain onset last night  

## 2020-10-07 NOTE — ED Provider Notes (Signed)
  Face-to-face evaluation   History: He presents for evaluation of abdominal discomfort with nausea and vomiting.  Onset last night.  History of similar problem in the past.  He denies fever, chills, weakness or dizziness.  Physical exam: Alert, cooperative.  Abdomen is protuberant but soft nontender palpation.  No tenderness around the umbilicus.  Medical screening examination/treatment/procedure(s) were conducted as a shared visit with non-physician practitioner(s) and myself.  I personally evaluated the patient during the encounter    Mancel Bale, MD 10/08/20 1154

## 2020-10-07 NOTE — H&P (Signed)
History and Physical  Bryce Cook CZY:[id number] DOB: Oct 17, 1940 DOA: 10/07/2020  Referring physician: Carroll Sage, PA-C  PCP: Zachery Dauer, MD  Patient coming from: Home  Chief Complaint: Abdominal pain   HPI: Bryce Cook is a 80 y.o. male with medical history significant for  atrial fibrillation (chronically on Xarelto), systolic heart failure (last ejection fraction up from 60% per medical record); hypertension, hyperlipidemia, gout and prior history of small bowel obstruction who presents to the emergency department due to 1 day onset of abdominal pain.  Patient states that abdominal pain started suddenly yesterday (6/18) and the pain was sharp, intermittent and rated as 8/10 on pain scale, he states that he was unable to sleep due to the persistent pain and he endorsed a single episode of nonbloody vomiting.  Patient complained of distended abdomen, He thought that the abdominal pain could be due to traction, since symptoms are similar to prior episode of bowel obstruction.  He denies chest pain, shortness of breath, fever, chills, headache. From 04/01/2018 to 04/06/2018 due to small bowel obstruction which resolved after conservative treatment  ED Course:  In the emergency department, he was hemodynamically stable.  BP was 157/88 and other vital signs are within normal range.  Work-up in the ED showed normal CBC except leukocytosis and normal BMP except for hyperglycemia.  Lipase 31 Chest x-ray showed satisfactory position of nasogastric tube with tip and side port below the level of the GE junction CT abdomen and pelvis without contrast showed changes consistent with partial small bowel obstruction in the proximal ileum secondary to adhesions from prior ventral hernia repair. He was treated with IV morphine 4 mg x 1, IV Zofran was given. General surgery (Dr. Lovell Sheehan) was consulted and will see patient in the morning ED PA.  Hospitalist was asked to admit patient for further  evaluation and management.  Review of Systems: Constitutional: Negative for chills and fever.  HENT: Negative for ear pain and sore throat.   Eyes: Negative for pain and visual disturbance.  Respiratory: Negative for cough, chest tightness and shortness of breath.   Cardiovascular: Negative for chest pain and palpitations.  Gastrointestinal: Positive for abdominal pain and vomiting.  Endocrine: Negative for polyphagia and polyuria.  Genitourinary: Negative for decreased urine volume, dysuria, enuresis Musculoskeletal: Negative for arthralgias and back pain.  Skin: Negative for color change and rash.  Allergic/Immunologic: Negative for immunocompromised state.  Neurological: Negative for tremors, syncope, speech difficult  Hematological: Does not bruise/bleed easily.  All other systems reviewed and are negative   Past Medical History:  Diagnosis Date   A-fib (HCC)    Abdominal hernia    Arthritis    Cardiomyopathy (HCC)    LVEF of 40-45% 2005, LVEF 60% 2013   Essential hypertension, benign    Gout    Hyperlipidemia    Small bowel obstruction (HCC)    Past Surgical History:  Procedure Laterality Date   APPENDECTOMY     EYE SURGERY     VENTRAL HERNIA REPAIR      Social History:  reports that he quit smoking about 43 years ago. His smoking use included cigarettes. He has never used smokeless tobacco. He reports current alcohol use. He reports that he does not use drugs.   Allergies  Allergen Reactions   Amiodarone Other (See Comments)    blister   Lipitor [Atorvastatin] Other (See Comments)    "body tightness"  "couldn't get out of the bed"   Sotalol Rash  Family History  Problem Relation Age of Onset   Heart failure Mother    Hypertension Mother    Hyperlipidemia Mother    Coronary artery disease Father        Died age 40 with MI   Hypertension Father    Hyperlipidemia Father      Prior to Admission medications   Medication Sig Start Date End Date Taking?  Authorizing Provider  acetaminophen (TYLENOL) 325 MG tablet Take 2 tablets (650 mg total) by mouth every 6 (six) hours as needed for mild pain, fever or headache. 04/06/18  Yes Vassie Loll, MD  allopurinol (ZYLOPRIM) 100 MG tablet Take 100 mg by mouth daily.  11/17/11  Yes [provider]  carvedilol (COREG) 25 MG tablet Take 25 mg by mouth daily. 09/12/20  Yes [provider]  cetirizine (ZYRTEC) 10 MG tablet Take 10 mg by mouth daily as needed for allergies.    Yes [provider]  furosemide (LASIX) 80 MG tablet Take 80 mg by mouth daily. 04/25/20  Yes [provider]  lisinopril (PRINIVIL,ZESTRIL) 20 MG tablet Take 20 mg by mouth daily.   Yes [provider]  Nintedanib (OFEV) 150 MG CAPS Take 150 mg by mouth 2 (two) times daily. 07/13/20  Yes [provider]  polyethylene glycol (MIRALAX / GLYCOLAX) packet Take 17 g by mouth daily as needed for mild constipation. 04/06/18  Yes Vassie Loll, MD  rivaroxaban (XARELTO) 20 MG TABS tablet Take 1 tablet by mouth at bedtime. 09/29/16  Yes [provider]  rosuvastatin (CRESTOR) 10 MG tablet Take 10 mg by mouth daily.   Yes [provider]    Physical Exam: BP 129/72   Pulse 87   Temp 97.9 F (36.6 C) (Oral)   Resp 18   SpO2 95%   General: 80 y.o. year-old male well developed well nourished in no acute distress.  Alert and oriented x3. HEENT: NCAT, EOMI Neck: Supple, trachea medial Cardiovascular: Regular rate and rhythm with no rubs or gallops.  No thyromegaly or JVD noted.  No lower extremity edema. 2/4 pulses in all 4 extremities. Respiratory: Clear to auscultation with no wheezes or rales. Good inspiratory effort. Abdomen: Soft, tender to palpation without guarding. Distended with normal bowel sounds x4 quadrants. Muskuloskeletal: No cyanosis, clubbing or edema noted bilaterally Neuro: CN II-XII intact, strength 5/5 x 4, sensation, reflexes intact Skin: No ulcerative  lesions noted or rashes Psychiatry:  mood is appropriate for condition and setting          Labs on Admission:  Basic Metabolic Panel: Recent Labs  Lab 10/07/20 1957  NA 138  K 3.8  CL 100  CO2 27  GLUCOSE 138*  BUN 23  CREATININE 0.85  CALCIUM 9.0   Liver Function Tests: Recent Labs  Lab 10/07/20 1957  AST 29  ALT 44  ALKPHOS 93  BILITOT 0.7  PROT 7.4  ALBUMIN 3.4*   Recent Labs  Lab 10/07/20 1957  LIPASE 31   No results for input(s): AMMONIA in the last 168 hours. CBC: Recent Labs  Lab 10/07/20 1957  WBC 13.6*  HGB 14.5  HCT 44.8  MCV 92.2  PLT 287   Cardiac Enzymes: No results for input(s): CKTOTAL, CKMB, CKMBINDEX, TROPONINI in the last 168 hours.  BNP (last 3 results) No results for input(s): BNP in the last 8760 hours.  ProBNP (last 3 results) No results for input(s): PROBNP in the last 8760 hours.  CBG: No results for input(s): GLUCAP  in the last 168 hours.  Radiological Exams on Admission: CT ABDOMEN PELVIS WO CONTRAST  Result Date: 10/07/2020 CLINICAL DATA:  Abdominal distension for 2 days EXAM: CT ABDOMEN AND PELVIS WITHOUT CONTRAST TECHNIQUE: Multidetector CT imaging of the abdomen and pelvis was performed following the standard protocol without IV contrast. COMPARISON:  04/01/2018 FINDINGS: Lower chest: Progressive scarring is noted in the lower lobes bilaterally. No sizable effusion or focal confluent infiltrate is seen. Defibrillator is noted. Hepatobiliary: No focal liver abnormality is seen. No gallstones, gallbladder wall thickening, or biliary dilatation. Pancreas: Unremarkable. No pancreatic ductal dilatation or surrounding inflammatory changes. Spleen: Normal in size without focal abnormality. Adrenals/Urinary Tract: Adrenal glands are within normal limits. Kidneys are well visualized bilaterally. No renal calculi or obstructive changes are seen. The ureters are within normal limits. Bladder is well distended. Stomach/Bowel:  Diverticular change of the colon is noted without significant diverticulitis. No obstructive changes are seen. The appendix has been surgically removed. Small bowel demonstrates mild proximal dilatation consistent with partial small bowel obstruction. Some fecalization of small bowel contents is noted. These loops of jejunum and likely proximal ileum are dilated to a an area of adhesion intimately related to the posterior aspect of the anterior abdominal wall where prior hernia repair was performed. The more distal small bowel is within normal limits. Transition zone is best seen on the coronal imaging image 21 of series 5 and the axial image number 68 of series 2. The more distal small bowel is within normal limits. Vascular/Lymphatic: Aortic atherosclerosis. No enlarged abdominal or pelvic lymph nodes. Reproductive: Prostate is unremarkable. Other: No abdominal wall hernia or abnormality. No abdominopelvic ascites. Musculoskeletal: Left hip prosthesis is noted. Degenerative changes of lumbar spine are seen. IMPRESSION: Changes consistent with partial small bowel obstruction in the proximal ileum secondary to adhesions from prior ventral hernia repair. Progressive scarring in the lung bases bilaterally. No acute infiltrate is seen. Electronically Signed   By: Alcide Clever M.D.   On: 10/07/2020 21:54   DG Chest Portable 1 View  Result Date: 10/08/2020 CLINICAL DATA:  Evaluate NG tube placement EXAM: PORTABLE CHEST 1 VIEW COMPARISON:  04/03/2018 FINDINGS: Left chest wall ICD is noted with lead in the right ventricle. There is a nasogastric tube with tip and side port below the level of the GE junction. There are diffuse reticulonodular interstitial past these identified throughout both lungs. No focal airspace consolidation identified. The visualized osseous structures are unremarkable. IMPRESSION: Satisfactory position of nasogastric tube with tip and side port below the level of the GE junction. Electronically  Signed   By: Signa Kell M.D.   On: 10/08/2020 00:27    EKG: I independently viewed the EKG done and my findings are as followed: EKG was not done in the ED  Assessment/Plan Present on Admission:  Partial small bowel obstruction (HCC)  Abdominal pain  A-fib (HCC)  Principal Problem:   Partial small bowel obstruction (HCC) Active Problems:   Essential hypertension   Hyperlipidemia   Abdominal pain   A-fib (HCC)   Chronic systolic CHF (congestive heart failure) (HCC)   Leukocytosis   Hyperglycemia   Abdominal pain and nausea secondary to partial small bowel obstruction Patient has prior history of abdominal surgery(ventral hernia repair and appendectomy) CT abdomen and pelvis without contrast showed changes consistent with partial small bowel obstruction in the proximal ileum secondary to adhesions from prior ventral hernia repair. Continue IV NS at 75 mLs/Hr Continue IV morphine 2 mg q.4h p.r.n. for moderate to  severe pain Continue IV Zofran p.r.n. Continue n.p.o. Continue NG tube (400 mL of brownish fluid noted in the canister at bedside) General surgery (Dr. Lovell Sheehan) was consulted to follow up with patient in the morning  Leukocytosis possibly reactive WBC 13.6; continue to monitor WBC with morning labs  Hyperglycemia possibly reactive CBG 138; patient without history of T2DM Continue to monitor blood glucose level with morning labs  Essential hypertension (controlled) Oral BP meds temporarily held due to patient being n.p.o. Consider starting IV hydralazine if BP becomes elevated prior to resuming oral meds  Hyperlipidemia Home statin will be continued once patient resumes oral intake  Atrial fibrillation on Xarelto Patient has history of atrial fibrillation  CHA2DS2- VASc score   is = 4    Which is  equal to = 4.8% annual risk of stroke  Xarelto and Coreg will be held at this time due to patient being n.p.o. Rate is currently controlled, IV metoprolol will be  considered if patient becomes tachycardic  History of chronic systolic CHF Last ejection fraction up from 60% per medical record Continue total input/output, daily weights and fluid restriction Continue Cardiac diet  Continue to hold Lasix, lisinopril, Coreg and Crestor at this time  History of gout No acute flare appreciated Resume the use of allopurinol once able to take by mouth.  DVT prophylaxis: SCDs (consider starting chemoprophylaxis if no need for surgical intervention) Code Status: Full code  Family Communication: None at bedside  Disposition Plan:  Patient is from:                        home Anticipated DC to:                   SNF or family members home Anticipated DC date:               2-3 days Anticipated DC barriers:          Patient requires inpatient management due to partial small bowel obstruction with abdominal pain pending surgical consult  Consults called: General surgery   Admission status: Inpatient    Frankey Shown MD [facility]  10/08/2020, 1:55 AM

## 2020-10-08 ENCOUNTER — Encounter (HOSPITAL_COMMUNITY): Payer: Self-pay | Admitting: Internal Medicine

## 2020-10-08 ENCOUNTER — Inpatient Hospital Stay (HOSPITAL_COMMUNITY): Payer: Medicare PPO

## 2020-10-08 DIAGNOSIS — R739 Hyperglycemia, unspecified: Secondary | ICD-10-CM

## 2020-10-08 DIAGNOSIS — D72829 Elevated white blood cell count, unspecified: Secondary | ICD-10-CM

## 2020-10-08 DIAGNOSIS — K56600 Partial intestinal obstruction, unspecified as to cause: Secondary | ICD-10-CM

## 2020-10-08 LAB — CBC
HCT: 45.4 % (ref 39.0–52.0)
Hemoglobin: 14.3 g/dL (ref 13.0–17.0)
MCH: 29.4 pg (ref 26.0–34.0)
MCHC: 31.5 g/dL (ref 30.0–36.0)
MCV: 93.4 fL (ref 80.0–100.0)
Platelets: 273 10*3/uL (ref 150–400)
RBC: 4.86 MIL/uL (ref 4.22–5.81)
RDW: 16 % — ABNORMAL HIGH (ref 11.5–15.5)
WBC: 12.7 10*3/uL — ABNORMAL HIGH (ref 4.0–10.5)
nRBC: 0 % (ref 0.0–0.2)

## 2020-10-08 LAB — COMPREHENSIVE METABOLIC PANEL
ALT: 38 U/L (ref 0–44)
AST: 26 U/L (ref 15–41)
Albumin: 3.2 g/dL — ABNORMAL LOW (ref 3.5–5.0)
Alkaline Phosphatase: 85 U/L (ref 38–126)
Anion gap: 8 (ref 5–15)
BUN: 21 mg/dL (ref 8–23)
CO2: 30 mmol/L (ref 22–32)
Calcium: 9.2 mg/dL (ref 8.9–10.3)
Chloride: 102 mmol/L (ref 98–111)
Creatinine, Ser: 0.89 mg/dL (ref 0.61–1.24)
GFR, Estimated: >60 mL/min (ref >60)
Glucose, Bld: 130 mg/dL — ABNORMAL HIGH (ref 70–99)
Potassium: 4 mmol/L (ref 3.5–5.1)
Sodium: 140 mmol/L (ref 135–145)
Total Bilirubin: 0.7 mg/dL (ref 0.3–1.2)
Total Protein: 7.1 g/dL (ref 6.5–8.1)

## 2020-10-08 LAB — MAGNESIUM: Magnesium: 2 mg/dL (ref 1.7–2.4)

## 2020-10-08 LAB — PHOSPHORUS: Phosphorus: 3.4 mg/dL (ref 2.5–4.6)

## 2020-10-08 LAB — SARS CORONAVIRUS 2 (TAT 6-24 HRS): SARS Coronavirus 2: NEGATIVE

## 2020-10-08 LAB — PROTIME-INR
INR: 1.5 — ABNORMAL HIGH (ref 0.8–1.2)
Prothrombin Time: 18.1 seconds — ABNORMAL HIGH (ref 11.4–15.2)

## 2020-10-08 LAB — APTT: aPTT: 33 seconds (ref 24–36)

## 2020-10-08 MED ORDER — HEPARIN SODIUM (PORCINE) 5000 UNIT/ML IJ SOLN: 5000.0000 [IU] | Freq: Three times a day (TID) | INTRAMUSCULAR | Status: DC

## 2020-10-08 MED ORDER — ALLOPURINOL 100 MG PO TABS
100.0000 mg | ORAL_TABLET | Freq: Every day | ORAL | Status: DC
  Administered 2020-10-08 – 2020-10-11 (×4): 100 mg via ORAL
  Filled 2020-10-08 (×4): qty 1

## 2020-10-08 MED ORDER — CARVEDILOL 12.5 MG PO TABS
25.0000 mg | ORAL_TABLET | Freq: Every day | ORAL | Status: DC
  Administered 2020-10-08 – 2020-10-11 (×4): 25 mg via ORAL
  Filled 2020-10-08 (×4): qty 2

## 2020-10-08 MED ORDER — KCL IN DEXTROSE-NACL 20-5-0.9 MEQ/L-%-% IV SOLN: INTRAVENOUS | Status: DC

## 2020-10-08 MED ORDER — ACETAMINOPHEN 325 MG PO TABS: 650.0000 mg | ORAL_TABLET | Freq: Four times a day (QID) | ORAL | Status: DC | PRN

## 2020-10-08 MED ORDER — ONDANSETRON HCL 4 MG/2ML IJ SOLN
4.0000 mg | Freq: Four times a day (QID) | INTRAMUSCULAR | Status: DC | PRN
  Administered 2020-10-08: 4 mg via INTRAVENOUS
  Filled 2020-10-08: qty 2

## 2020-10-08 MED ORDER — SODIUM CHLORIDE 0.9 % IV SOLN: Freq: Once | INTRAVENOUS | Status: AC

## 2020-10-08 MED ORDER — MORPHINE SULFATE (PF) 2 MG/ML IV SOLN
2.0000 mg | INTRAVENOUS | Status: DC | PRN
Start: 2020-10-08 — End: 2020-10-11
  Administered 2020-10-08: 2 mg via INTRAVENOUS
  Filled 2020-10-08: qty 1

## 2020-10-08 NOTE — Progress Notes (Signed)
NG tube clamped patient walking around in room no complaints of SOB on exertion o2 maintains low 90s comes up to mid 90s with deep breaths. Encouraged patient to cough and deep breath. NG tube placement checked, no output today at all. Dr Henreitta Leber made aware. Will continue to monitor patient. Call light within reach.

## 2020-10-08 NOTE — Progress Notes (Signed)
HOKE BAER  TRR:[id number] DOB: 1940-07-30 DOA: 10/07/2020 PCP: Zachery Dauer, MD    Brief Narrative:  80 year old with a history of atrial fibrillation on Xarelto, CHF, HTN, HLD, gout, and prior SBO who presented to the ED with 24 hours of severe abdominal pain and frequent vomiting with a distended abdomen.  In the ED CBC was remarkable for leukocytosis and lipase was normal at 31.  CT abdomen pelvis noted partial small bowel obstruction in the proximal ileum likely due to adhesions from prior ventral hernia repair.  Significant Events:  6/19 admit via ED  Consultants:  General Surgery  Code Status: FULL CODE  Antimicrobials:  None  DVT prophylaxis: SCDs  Subjective: Afebrile.  Vital signs stable.  Heart rate controlled at 70-88.  Oxygen saturation 90% on room air. Abdom pain stable. No new complaints.   Assessment & Plan:  Partial small bowel obstruction Continue IV fluid support and symptom management - General Surgery to see in consultation - maximize electrolytes  Hyperglycemia No known history of DM -likely simply stress reaction -check A1c  HTN Blood pressure currently controlled  HLD Hold medical therapy until oral intake resumes  Chronic atrial fibrillation on Xarelto CHA2DS2-VASc is 4 - hold Xarelto in anticipation of possible need for surgery - continue Coreg  History of chronic systolic CHF Appears to have resolved with most recent EF 60% via TTE - euvolemic at present  Gout Well compensated at present   Family Communication:  Status is: Inpatient  Remains inpatient appropriate because:Inpatient level of care appropriate due to severity of illness  Dispo: The patient is from: Home              Anticipated d/c is to: Home              Patient currently is not medically stable to d/c.   Difficult to place patient No   Objective: Blood pressure 133/86, pulse (!) 104, temperature (!) 97.4 F (36.3 C), resp. rate 20, height 5\' 10"  (1.778 m),  weight 108.9 kg, SpO2 94 %. No intake or output data in the 24 hours ending 10/08/20 1743 Filed Weights   10/08/20 0817  Weight: 108.9 kg    Examination: General: No acute respiratory distress Lungs: Clear to auscultation bilaterally without wheezes or crackles Cardiovascular: Regular rate and rhythm without murmur gallop or rub normal S1 and S2 Abdomen: mildly distended/protuberent, bs hypoactive, no rebound, soft Extremities: No significant cyanosis, clubbing, or edema bilateral lower extremities  CBC: Recent Labs  Lab 10/07/20 1957 10/08/20 0517  WBC 13.6* 12.7*  HGB 14.5 14.3  HCT 44.8 45.4  MCV 92.2 93.4  PLT 287 273   Basic Metabolic Panel: Recent Labs  Lab 10/07/20 1957 10/08/20 0517  NA 138 140  K 3.8 4.0  CL 100 102  CO2 27 30  GLUCOSE 138* 130*  BUN 23 21  CREATININE 0.85 0.89  CALCIUM 9.0 9.2  MG  --  2.0  PHOS  --  3.4   GFR: Estimated Creatinine Clearance: 83.2 mL/min (by C-G formula based on SCr of 0.89 mg/dL).  Liver Function Tests: Recent Labs  Lab 10/07/20 1957 10/08/20 0517  AST 29 26  ALT 44 38  ALKPHOS 93 85  BILITOT 0.7 0.7  PROT 7.4 7.1  ALBUMIN 3.4* 3.2*   Recent Labs  Lab 10/07/20 1957  LIPASE 31    Coagulation Profile: Recent Labs  Lab 10/08/20 0517  INR 1.5*    Scheduled Meds:  allopurinol  100  mg Oral Daily   carvedilol  25 mg Oral Daily      LOS: 1 day   Lonia Blood, MD [facility] Office  [phone] Pager - Text Page per Amion  If 7PM-7AM, please contact night-coverage per Amion 10/08/2020, 5:43 PM

## 2020-10-08 NOTE — Consult Note (Addendum)
[facility] Consult  Reason for Consult: pSBO  Referring Physician: Dr. Sharon Seller   Chief Complaint   Abdominal Pain     HPI: Bryce Cook is a 80 y.o. male with pSBO on CT scan. He had had prior ventral hernia and had prior SBO in 2019 and one before that time. He has always improved with conservative management. He comes in with abdominal pain since Saturday, nausea and vomiting. He did have a BM yesterday. He came to the ED and NG was placed. He denies any flatus or BM. Currently he says his abdomen is less distended. He wants to ambulate.   Past Medical History:  Diagnosis Date   [facility])    Abdominal hernia    Arthritis    Cardiomyopathy (HCC)    LVEF of 40-45% 2005, LVEF 60% 2013   Essential hypertension, benign    Gout    Hyperlipidemia    Small bowel obstruction (HCC)     Past Surgical History:  Procedure Laterality Date   APPENDECTOMY     EYE SURGERY     VENTRAL HERNIA REPAIR      Family History  Problem Relation Age of Onset   Heart failure Mother    Hypertension Mother    Hyperlipidemia Mother    Coronary artery disease Father        Died age 91 with MI   Hypertension Father    Hyperlipidemia Father     Social History   Tobacco Use   Smoking status: Former    Pack years: 0.00    Types: Cigarettes    Quit date: 04/21/1977    Years since quitting: 43.4   Smokeless tobacco: Never  Vaping Use   Vaping Use: Never used  Substance Use Topics   Alcohol use: Yes    Comment: Occasionally   Drug use: No    Medications: I have reviewed the patient's current medications. Prior to Admission:  Medications Prior to Admission  Medication Sig Dispense Refill Last Dose   acetaminophen (TYLENOL) 325 MG tablet Take 2 tablets (650 mg total) by mouth every 6 (six) hours as needed for mild pain, fever or headache. 40 tablet 0 Past Week   allopurinol (ZYLOPRIM) 100 MG tablet Take 100 mg by mouth daily.    10/07/2020   carvedilol (COREG) 25 MG  tablet Take 25 mg by mouth daily.   10/07/2020 at 0600   cetirizine (ZYRTEC) 10 MG tablet Take 10 mg by mouth daily as needed for allergies.    PRN   furosemide (LASIX) 80 MG tablet Take 80 mg by mouth daily.   10/07/2020   lisinopril (PRINIVIL,ZESTRIL) 20 MG tablet Take 20 mg by mouth daily.   10/07/2020   Nintedanib (OFEV) 150 MG CAPS Take 150 mg by mouth 2 (two) times daily.   10/07/2020   polyethylene glycol (MIRALAX / GLYCOLAX) packet Take 17 g by mouth daily as needed for mild constipation. 28 each 1 PRN   rivaroxaban (XARELTO) 20 MG TABS tablet Take 1 tablet by mouth at bedtime.   10/06/2020 at 1800   rosuvastatin (CRESTOR) 10 MG tablet Take 10 mg by mouth daily.   10/07/2020   Scheduled:  allopurinol  100 mg Oral Daily   carvedilol  25 mg Oral Daily   Continuous: ZWC:HENIDPOEUMPNT, morphine injection, ondansetron (ZOFRAN) IV  Allergies  Allergen Reactions   Amiodarone Other (See Comments)    blister   Lipitor [Atorvastatin] Other (See Comments)    "body tightness"  "couldn't  get out of the bed"   Sotalol Rash     ROS:  A comprehensive review of systems was negative except for: Gastrointestinal: positive for abdominal pain, constipation, nausea, vomiting, and distention  Blood pressure (!) 140/98, pulse 88, temperature (!) 97.4 F (36.3 C), resp. rate 20, height 5\' 10"  (1.778 m), weight 108.9 kg, SpO2 92 %. Physical Exam Vitals reviewed.  Constitutional:      Appearance: He is obese.  HENT:     Head: Normocephalic.  Eyes:     Extraocular Movements: Extraocular movements intact.  Cardiovascular:     Rate and Rhythm: Normal rate.  Pulmonary:     Effort: Pulmonary effort is normal.  Abdominal:     General: There is distension.     Palpations: Abdomen is soft.     Tenderness: There is no guarding or rebound.  Musculoskeletal:     Comments: Moves all extremities   Skin:    General: Skin is warm.  Neurological:     General: No focal deficit present.     Mental Status:  He is alert and oriented to person, place, and time.  Psychiatric:        Mood and Affect: Mood normal.        Behavior: Behavior normal.    Results: Results for orders placed or performed during the hospital encounter of 10/07/20 (from the past 48 hour(s))  Lipase, blood     Status: None   Collection Time: 10/07/20  7:57 PM  Result Value Ref Range   Lipase 31 11 - 51 U/L    Comment: Performed at [facility], [address], [place], [place]  Comprehensive metabolic panel     Status: Abnormal   Collection Time: 10/07/20  7:57 PM  Result Value Ref Range   Sodium 138 135 - 145 mmol/L   Potassium 3.8 3.5 - 5.1 mmol/L   Chloride 100 98 - 111 mmol/L   CO2 27 22 - 32 mmol/L   Glucose, Bld 138 (H) 70 - 99 mg/dL    Comment: Glucose reference range applies only to samples taken after fasting for at least 8 hours.   BUN 23 8 - 23 mg/dL   Creatinine, Ser 10/09/20 0.61 - 1.24 mg/dL   Calcium 9.0 8.9 - 4.03 mg/dL   Total Protein 7.4 6.5 - 8.1 g/dL   Albumin 3.4 (L) 3.5 - 5.0 g/dL   AST 29 15 - 41 U/L   ALT 44 0 - 44 U/L   Alkaline Phosphatase 93 38 - 126 U/L   Total Bilirubin 0.7 0.3 - 1.2 mg/dL   GFR, Estimated 47.4 >25 mL/min    Comment: (NOTE) Calculated using the CKD-EPI Creatinine Equation (2021)    Anion gap 11 5 - 15    Comment: Performed at [facility], [address], [place], [place]  CBC     Status: Abnormal   Collection Time: 10/07/20  7:57 PM  Result Value Ref Range   WBC 13.6 (H) 4.0 - 10.5 K/uL   RBC 4.86 4.22 - 5.81 MIL/uL   Hemoglobin 14.5 13.0 - 17.0 g/dL   HCT 10/09/20 64.3 - 32.9 %   MCV 92.2 80.0 - 100.0 fL   MCH 29.8 26.0 - 34.0 pg   MCHC 32.4 30.0 - 36.0 g/dL   RDW 51.8 (H) 84.1 - 66.0 %   Platelets 287 150 - 400 K/uL   nRBC 0.0 0.0 - 0.2 %    Comment: Performed at [facility],  [address], [place], [place] [address]  Comprehensive metabolic panel     Status: Abnormal   Collection Time: 10/08/20  5:17 AM  Result Value Ref Range   Sodium  140 135 - 145 mmol/L   Potassium 4.0 3.5 - 5.1 mmol/L   Chloride 102 98 - 111 mmol/L   CO2 30 22 - 32 mmol/L   Glucose, Bld 130 (H) 70 - 99 mg/dL    Comment: Glucose reference range applies only to samples taken after fasting for at least 8 hours.   BUN 21 8 - 23 mg/dL   Creatinine, Ser 9.16 0.61 - 1.24 mg/dL   Calcium 9.2 8.9 - 60.6 mg/dL   Total Protein 7.1 6.5 - 8.1 g/dL   Albumin 3.2 (L) 3.5 - 5.0 g/dL   AST 26 15 - 41 U/L   ALT 38 0 - 44 U/L   Alkaline Phosphatase 85 38 - 126 U/L   Total Bilirubin 0.7 0.3 - 1.2 mg/dL   GFR, Estimated >00 >45 mL/min    Comment: (NOTE) Calculated using the CKD-EPI Creatinine Equation (2021)    Anion gap 8 5 - 15    Comment: Performed at [facility], [address] [place], [place] [address]  CBC     Status: Abnormal   Collection Time: 10/08/20  5:17 AM  Result Value Ref Range   WBC 12.7 (H) 4.0 - 10.5 K/uL   RBC 4.86 4.22 - 5.81 MIL/uL   Hemoglobin 14.3 13.0 - 17.0 g/dL   HCT 14.2 39.5 - 32.0 %   MCV 93.4 80.0 - 100.0 fL   MCH 29.4 26.0 - 34.0 pg   MCHC 31.5 30.0 - 36.0 g/dL   RDW 23.3 (H) 43.5 - 68.6 %   Platelets 273 150 - 400 K/uL   nRBC 0.0 0.0 - 0.2 %    Comment: Performed at [facility], [address] [place], [place] [address]  Protime-INR     Status: Abnormal   Collection Time: 10/08/20  5:17 AM  Result Value Ref Range   Prothrombin Time 18.1 (H) 11.4 - 15.2 seconds   INR 1.5 (H) 0.8 - 1.2    Comment: (NOTE) INR goal varies based on device and disease states. Performed at [facility], [address] [place], [place] [address]   APTT     Status: None   Collection Time: 10/08/20  5:17 AM  Result Value Ref Range   aPTT 33 24 - 36 seconds    Comment: Performed at [facility], [address], [place], [place] [address]  Magnesium     Status: None   Collection Time: 10/08/20  5:17 AM  Result Value Ref Range   Magnesium 2.0 1.7 - 2.4 mg/dL    Comment: Performed at [facility], [address], [place], [place] [address]   Phosphorus     Status: None   Collection Time: 10/08/20  5:17 AM  Result Value Ref Range   Phosphorus 3.4 2.5 - 4.6 mg/dL    Comment: Performed at [facility], [address] [place], [place] [address]   Personally reviewed- small bowel with some dilation and fecalization at the anterior abdomen near hernia repair, gradual transition to decompressed bowel   CT ABDOMEN PELVIS WO CONTRAST  Result Date: 10/07/2020 CLINICAL DATA:  Abdominal distension for 2 days EXAM: CT ABDOMEN AND PELVIS WITHOUT CONTRAST TECHNIQUE: Multidetector CT imaging of the abdomen and pelvis was performed following the standard protocol without IV contrast. COMPARISON:  04/01/2018 FINDINGS: Lower chest:  Progressive scarring is noted in the lower lobes bilaterally. No sizable effusion or focal confluent infiltrate is seen. Defibrillator is noted. Hepatobiliary: No focal liver abnormality is seen. No gallstones, gallbladder wall thickening, or biliary dilatation. Pancreas: Unremarkable. No pancreatic ductal dilatation or surrounding inflammatory changes. Spleen: Normal in size without focal abnormality. Adrenals/Urinary Tract: Adrenal glands are within normal limits. Kidneys are well visualized bilaterally. No renal calculi or obstructive changes are seen. The ureters are within normal limits. Bladder is well distended. Stomach/Bowel: Diverticular change of the colon is noted without significant diverticulitis. No obstructive changes are seen. The appendix has been surgically removed. Small bowel demonstrates mild proximal dilatation consistent with partial small bowel obstruction. Some fecalization of small bowel contents is noted. These loops of jejunum and likely proximal ileum are dilated to a an area of adhesion intimately related to the posterior aspect of the anterior abdominal wall where prior hernia repair was performed. The more distal small bowel is within normal limits. Transition zone is best seen on the coronal imaging  image 21 of series 5 and the axial image number 68 of series 2. The more distal small bowel is within normal limits. Vascular/Lymphatic: Aortic atherosclerosis. No enlarged abdominal or pelvic lymph nodes. Reproductive: Prostate is unremarkable. Other: No abdominal wall hernia or abnormality. No abdominopelvic ascites. Musculoskeletal: Left hip prosthesis is noted. Degenerative changes of lumbar spine are seen. IMPRESSION: Changes consistent with partial small bowel obstruction in the proximal ileum secondary to adhesions from prior ventral hernia repair. Progressive scarring in the lung bases bilaterally. No acute infiltrate is seen. Electronically Signed   By: Alcide Clever M.D.   On: 10/07/2020 21:54   DG Chest Portable 1 View  Result Date: 10/08/2020 CLINICAL DATA:  Evaluate NG tube placement EXAM: PORTABLE CHEST 1 VIEW COMPARISON:  04/03/2018 FINDINGS: Left chest wall ICD is noted with lead in the right ventricle. There is a nasogastric tube with tip and side port below the level of the GE junction. There are diffuse reticulonodular interstitial past these identified throughout both lungs. No focal airspace consolidation identified. The visualized osseous structures are unremarkable. IMPRESSION: Satisfactory position of nasogastric tube with tip and side port below the level of the GE junction. Electronically Signed   By: Signa Kell M.D.   On: 10/08/2020 00:27     Assessment & Plan:  LUCKY TROTTA is a 80 y.o. male with pSBO who is doing fair. NG in place, minimal output.  -NG, NPO with meds and chips  -Plan for SBFT tomorrow  -Hold Xarelto pending need for surgery  Discussed potential need for surgery but attempts at NG NPO management.   All questions were answered to the satisfaction of the patient. Updated team.    Lucretia Roers 10/08/2020, 11:04 AM

## 2020-10-09 ENCOUNTER — Inpatient Hospital Stay (HOSPITAL_COMMUNITY): Payer: Medicare PPO

## 2020-10-09 LAB — CBC
HCT: 40.4 % (ref 39.0–52.0)
Hemoglobin: 12.7 g/dL — ABNORMAL LOW (ref 13.0–17.0)
MCH: 30.1 pg (ref 26.0–34.0)
MCHC: 31.4 g/dL (ref 30.0–36.0)
MCV: 95.7 fL (ref 80.0–100.0)
Platelets: 236 10*3/uL (ref 150–400)
RBC: 4.22 MIL/uL (ref 4.22–5.81)
RDW: 16.8 % — ABNORMAL HIGH (ref 11.5–15.5)
WBC: 11.1 10*3/uL — ABNORMAL HIGH (ref 4.0–10.5)
nRBC: 0 % (ref 0.0–0.2)

## 2020-10-09 LAB — COMPREHENSIVE METABOLIC PANEL
ALT: 29 U/L (ref 0–44)
AST: 24 U/L (ref 15–41)
Albumin: 3.1 g/dL — ABNORMAL LOW (ref 3.5–5.0)
Alkaline Phosphatase: 77 U/L (ref 38–126)
Anion gap: 7 (ref 5–15)
BUN: 20 mg/dL (ref 8–23)
CO2: 26 mmol/L (ref 22–32)
Calcium: 8.5 mg/dL — ABNORMAL LOW (ref 8.9–10.3)
Chloride: 105 mmol/L (ref 98–111)
Creatinine, Ser: 0.82 mg/dL (ref 0.61–1.24)
GFR, Estimated: >60 mL/min (ref >60)
Glucose, Bld: 116 mg/dL — ABNORMAL HIGH (ref 70–99)
Potassium: 4.1 mmol/L (ref 3.5–5.1)
Sodium: 138 mmol/L (ref 135–145)
Total Bilirubin: 1.1 mg/dL (ref 0.3–1.2)
Total Protein: 6.8 g/dL (ref 6.5–8.1)

## 2020-10-09 LAB — HEMOGLOBIN A1C
Hgb A1c MFr Bld: 7.2 % — ABNORMAL HIGH (ref 4.8–5.6)
Mean Plasma Glucose: 159.94 mg/dL

## 2020-10-09 LAB — MAGNESIUM: Magnesium: 2.1 mg/dL (ref 1.7–2.4)

## 2020-10-09 LAB — PHOSPHORUS: Phosphorus: 2.3 mg/dL — ABNORMAL LOW (ref 2.5–4.6)

## 2020-10-09 MED ORDER — DIATRIZOATE MEGLUMINE & SODIUM 66-10 % PO SOLN
90.0000 mL | Freq: Once | ORAL | Status: AC
  Administered 2020-10-09: 90 mL via NASOGASTRIC
  Filled 2020-10-09: qty 90

## 2020-10-09 MED ORDER — DIATRIZOATE MEGLUMINE & SODIUM 66-10 % PO SOLN
ORAL | Status: AC
  Filled 2020-10-09: qty 90

## 2020-10-09 MED ORDER — COLCHICINE 0.6 MG PO TABS
1.2000 mg | ORAL_TABLET | Freq: Once | ORAL | Status: AC
  Administered 2020-10-09: 1.2 mg via ORAL
  Filled 2020-10-09: qty 2

## 2020-10-09 MED ORDER — COLCHICINE 0.6 MG PO TABS
0.6000 mg | ORAL_TABLET | Freq: Every day | ORAL | Status: DC
  Administered 2020-10-10 – 2020-10-11 (×2): 0.6 mg via ORAL
  Filled 2020-10-09 (×2): qty 1

## 2020-10-09 MED ORDER — COLCHICINE 0.6 MG PO TABS
0.6000 mg | ORAL_TABLET | Freq: Once | ORAL | Status: AC
  Administered 2020-10-09: 0.6 mg via ORAL
  Filled 2020-10-09: qty 1

## 2020-10-09 NOTE — Progress Notes (Signed)
[facility] Progress Note     Subjective: Flatus some. SBFT ordered this AM.   Objective: Vital signs in last 24 hours: Temp:  [95 F (35 C)] 95 F (35 C) (06/20 2048) Pulse Rate:  [68-104] 75 (06/21 0458) Resp:  [20] 20 (06/21 0458) BP: (123-147)/(60-90) 137/90 (06/21 0458) SpO2:  [93 %-96 %] 96 % (06/21 0458) Weight:  [937 kg] 111 kg (06/21 0500) Last BM Date: 10/07/20  Intake/Output from previous day: 06/20 0701 - 06/21 0700 In: 443.8 [I.V.:443.8] Out: -  Intake/Output this shift: No intake/output data recorded.  General appearance: alert, cooperative, and no distress Resp: normal work of breathing GI: soft, distended, minimally tender  Lab Results:  Recent Labs    10/08/20 0517 10/09/20 0451  WBC 12.7* 11.1*  HGB 14.3 12.7*  HCT 45.4 40.4  PLT 273 236   BMET Recent Labs    10/08/20 0517 10/09/20 0451  NA 140 138  K 4.0 4.1  CL 102 105  CO2 30 26  GLUCOSE 130* 116*  BUN 21 20  CREATININE 0.89 0.82  CALCIUM 9.2 8.5*   PT/INR Recent Labs    10/08/20 0517  LABPROT 18.1*  INR 1.5*    Studies/Results: CT ABDOMEN PELVIS WO CONTRAST  Result Date: 10/07/2020 CLINICAL DATA:  Abdominal distension for 2 days EXAM: CT ABDOMEN AND PELVIS WITHOUT CONTRAST TECHNIQUE: Multidetector CT imaging of the abdomen and pelvis was performed following the standard protocol without IV contrast. COMPARISON:  04/01/2018 FINDINGS: Lower chest: Progressive scarring is noted in the lower lobes bilaterally. No sizable effusion or focal confluent infiltrate is seen. Defibrillator is noted. Hepatobiliary: No focal liver abnormality is seen. No gallstones, gallbladder wall thickening, or biliary dilatation. Pancreas: Unremarkable. No pancreatic ductal dilatation or surrounding inflammatory changes. Spleen: Normal in size without focal abnormality. Adrenals/Urinary Tract: Adrenal glands are within normal limits. Kidneys are well visualized bilaterally. No renal  calculi or obstructive changes are seen. The ureters are within normal limits. Bladder is well distended. Stomach/Bowel: Diverticular change of the colon is noted without significant diverticulitis. No obstructive changes are seen. The appendix has been surgically removed. Small bowel demonstrates mild proximal dilatation consistent with partial small bowel obstruction. Some fecalization of small bowel contents is noted. These loops of jejunum and likely proximal ileum are dilated to a an area of adhesion intimately related to the posterior aspect of the anterior abdominal wall where prior hernia repair was performed. The more distal small bowel is within normal limits. Transition zone is best seen on the coronal imaging image 21 of series 5 and the axial image number 68 of series 2. The more distal small bowel is within normal limits. Vascular/Lymphatic: Aortic atherosclerosis. No enlarged abdominal or pelvic lymph nodes. Reproductive: Prostate is unremarkable. Other: No abdominal wall hernia or abnormality. No abdominopelvic ascites. Musculoskeletal: Left hip prosthesis is noted. Degenerative changes of lumbar spine are seen. IMPRESSION: Changes consistent with partial small bowel obstruction in the proximal ileum secondary to adhesions from prior ventral hernia repair. Progressive scarring in the lung bases bilaterally. No acute infiltrate is seen. Electronically Signed   By: Alcide Clever M.D.   On: 10/07/2020 21:54   DG Chest Port 1 View  Result Date: 10/08/2020 CLINICAL DATA:  NG tube placement EXAM: PORTABLE CHEST 1 VIEW COMPARISON:  Earlier today. FINDINGS: The nasogastric tube tip is 10.7 cm below the GE junction. Left chest wall ICD noted with lead in the right ventricle. Stable cardiomediastinal contours. Diffuse reticular and nodular opacities are  identified throughout both lungs and appear unchanged from previous exam. IMPRESSION: 1. No change in aeration to the lungs compared with previous exam. 2.  Nasogastric tube tip is 10.7 cm below the GE junction. Electronically Signed   By: Signa Kell M.D.   On: 10/08/2020 19:29   DG Chest Portable 1 View  Result Date: 10/08/2020 CLINICAL DATA:  Evaluate NG tube placement EXAM: PORTABLE CHEST 1 VIEW COMPARISON:  04/03/2018 FINDINGS: Left chest wall ICD is noted with lead in the right ventricle. There is a nasogastric tube with tip and side port below the level of the GE junction. There are diffuse reticulonodular interstitial past these identified throughout both lungs. No focal airspace consolidation identified. The visualized osseous structures are unremarkable. IMPRESSION: Satisfactory position of nasogastric tube with tip and side port below the level of the GE junction. Electronically Signed   By: Signa Kell M.D.   On: 10/08/2020 00:27   DG Abd Portable 1V-Small Bowel Protocol-Position Verification  Result Date: 10/09/2020 CLINICAL DATA:  NG tube placement EXAM: PORTABLE ABDOMEN - 1 VIEW COMPARISON:  None. FINDINGS: There are multiple mildly dilated loops of small bowel. There is an NG tube with side port overlying the region of the GE junction and tip overlying the stomach. Degenerative changes of the spine. Prior hernia repair. Partially visualized left hip arthroplasty. IMPRESSION: NG tube side port overlies the region of the GE junction and tip overlies the stomach, consider advancing 3 cm. Multiple dilated loops of small bowel consistent with obstruction. Electronically Signed   By: Caprice Renshaw   On: 10/09/2020 11:08    Anti-infectives: Anti-infectives (From admission, onward)    None       Assessment/Plan: Bryce Cook is a 80 yo with pSBO that is getting SBFT today. Hopefully will open up. If not may have to proceed with surgery. No recent chest pain or SOB. Prior history of CHF and would need preop ECHO. Can decide tomorrow about all of this.   NPO SBFT today    LOS: 2 days    Lucretia Roers 10/09/2020

## 2020-10-09 NOTE — Progress Notes (Signed)
Sbft With contrast to the colon but still some small bowel dilation. Will get x-ray in the a.m. to see if resolved.

## 2020-10-09 NOTE — Progress Notes (Addendum)
Bryce Cook  XTK:[id number] DOB: [birth year] DOA: 10/07/2020 PCP: Zachery Dauer, MD    Brief Narrative:  80 year old with a history of atrial fibrillation on Xarelto, CHF, HTN, HLD, gout, and prior SBO who presented to the ED with 24 hours of severe abdominal pain and frequent vomiting with a distended abdomen.  In the ED CBC was remarkable for leukocytosis and lipase was normal at 31.  CT abdomen pelvis noted partial small bowel obstruction in the proximal ileum likely due to adhesions from prior ventral hernia repair.  Significant Events:  6/19 admit via ED  Consultants:  General Surgery  Code Status: FULL CODE  Antimicrobials:  None  DVT prophylaxis: SCDs  Subjective: Afebrile.  Vital signs stable.  Heart rate controlled.  Electrolytes at goal.  Not yet moving bowels.  To have small bowel follow-through today.  Reports progressive onset with acute worsening of left wrist edema and pain consistent with prior episodes of gout flare.  Denies chest pain or shortness of breath.  Assessment & Plan:  Partial small bowel obstruction Continue IV fluid support and symptom management - General Surgery following in consultation -electrolytes at goal  Hyperglycemia No known history of DM - likely simply stress reaction - A1c pending  HTN Blood pressure controlled  HLD Hold medical therapy until oral intake resumes  Chronic atrial fibrillation on Xarelto CHA2DS2-VASc is 4 - hold Xarelto in anticipation of possible need for surgery - continue Coreg  History of chronic systolic CHF Appears to have resolved with most recent EF 60% via TTE - euvolemic at present  Gout Experiencing potential acute flare of wrist -short course of colchicine in attempt to stop the acute flare   Family Communication: No family present at time of exam Status is: Inpatient  Remains inpatient appropriate because:Inpatient level of care appropriate due to severity of illness  Dispo: The patient is  from: Home              Anticipated d/c is to: Home              Patient currently is not medically stable to d/c.   Difficult to place patient No   Objective: Blood pressure 137/90, pulse 75, temperature (!) 95 F (35 C), resp. rate 20, height 5\' 10"  (1.778 m), weight 111 kg, SpO2 96 %.  Intake/Output Summary (Last 24 hours) at 10/09/2020 1137 Last data filed at 10/09/2020 0300 Gross per 24 hour  Intake 443.78 ml  Output --  Net 443.78 ml   Filed Weights   10/08/20 0817 10/09/20 0500  Weight: 108.9 kg 111 kg    Examination: General: No acute respiratory distress Lungs: CTA B without wheezing Cardiovascular: RRR without murmur or rub Abdomen: Protuberant but soft, NG in place, no rebound, no appreciable mass Extremities: No signif c/c/e B LE -left wrist diffusely edematous as is left hand with full range of motion and intact sensation with 2+ radial pulse and no overlying cutaneous wounds   CBC: Recent Labs  Lab 10/07/20 1957 10/08/20 0517 10/09/20 0451  WBC 13.6* 12.7* 11.1*  HGB 14.5 14.3 12.7*  HCT 44.8 45.4 40.4  MCV 92.2 93.4 95.7  PLT 287 273 236    Basic Metabolic Panel: Recent Labs  Lab 10/07/20 1957 10/08/20 0517 10/09/20 0451  NA 138 140 138  K 3.8 4.0 4.1  CL 100 102 105  CO2 27 30 26   GLUCOSE 138* 130* 116*  BUN 23 21 20   CREATININE 0.85 0.89 0.82  CALCIUM  9.0 9.2 8.5*  MG  --  2.0 2.1  PHOS  --  3.4 2.3*    GFR: Estimated Creatinine Clearance: 91.1 mL/min (by C-G formula based on SCr of 0.82 mg/dL).  Liver Function Tests: Recent Labs  Lab 10/07/20 1957 10/08/20 0517 10/09/20 0451  AST 29 26 24   ALT 44 38 29  ALKPHOS 93 85 77  BILITOT 0.7 0.7 1.1  PROT 7.4 7.1 6.8  ALBUMIN 3.4* 3.2* 3.1*    Recent Labs  Lab 10/07/20 1957  LIPASE 31     Coagulation Profile: Recent Labs  Lab 10/08/20 0517  INR 1.5*     Scheduled Meds:  allopurinol  100 mg Oral Daily   carvedilol  25 mg Oral Daily      LOS: 2 days   10/10/20, MD [facility] Office  [phone] Pager - Text Page per [phone]  If 7PM-7AM, please contact night-coverage per Amion 10/09/2020, 11:37 AM

## 2020-10-10 ENCOUNTER — Other Ambulatory Visit (HOSPITAL_COMMUNITY): Payer: Self-pay | Admitting: *Deleted

## 2020-10-10 ENCOUNTER — Encounter (HOSPITAL_COMMUNITY): Payer: Self-pay | Admitting: Anesthesiology

## 2020-10-10 ENCOUNTER — Inpatient Hospital Stay (HOSPITAL_COMMUNITY): Payer: Medicare PPO

## 2020-10-10 DIAGNOSIS — I5022 Chronic systolic (congestive) heart failure: Secondary | ICD-10-CM | POA: Diagnosis not present

## 2020-10-10 LAB — ECHOCARDIOGRAM COMPLETE
Area-P 1/2: 4.26 cm2
Calc EF: 36.6 %
Height: 70 in
MV M vel: 4.66 m/s
MV Peak grad: 86.9 mmHg
S' Lateral: 5.1 cm
Single Plane A2C EF: 43.7 %
Single Plane A4C EF: 24.7 %
Weight: 3901.26 oz

## 2020-10-10 MED ORDER — BISACODYL 10 MG RE SUPP
10.0000 mg | Freq: Two times a day (BID) | RECTAL | Status: DC
  Administered 2020-10-10 (×2): 10 mg via RECTAL
  Filled 2020-10-10 (×3): qty 1

## 2020-10-10 NOTE — Progress Notes (Signed)
[facility] Progress Note     Subjective: Patient with less distention, no pain, having flatus. KUB with contrast in colon but dilated SB continues. NG output less. NO BM.   Objective: Vital signs in last 24 hours: Temp:  [98 F (36.7 C)-98.2 F (36.8 C)] 98.1 F (36.7 C) (06/22 0354) Pulse Rate:  [79-89] 89 (06/22 0354) Resp:  [20] 20 (06/22 0354) BP: (111-151)/(63-89) 111/63 (06/22 0354) SpO2:  [95 %-99 %] 97 % (06/22 0354) Weight:  [110.6 kg] 110.6 kg (06/22 0317) Last BM Date: 10/07/20  Intake/Output from previous day: 06/21 0701 - 06/22 0700 In: -  Out: 650 [Emesis/NG output:650] Intake/Output this shift: No intake/output data recorded.  General appearance: alert, cooperative, and no distress Resp: normal work of breathing GI: soft, distended mildly, nontender  Lab Results:  Recent Labs    10/08/20 0517 10/09/20 0451  WBC 12.7* 11.1*  HGB 14.3 12.7*  HCT 45.4 40.4  PLT 273 236   BMET Recent Labs    10/08/20 0517 10/09/20 0451  NA 140 138  K 4.0 4.1  CL 102 105  CO2 30 26  GLUCOSE 130* 116*  BUN 21 20  CREATININE 0.89 0.82  CALCIUM 9.2 8.5*   PT/INR Recent Labs    10/08/20 0517  LABPROT 18.1*  INR 1.5*    Studies/Results: DG Abd 1 View  Result Date: 10/10/2020 CLINICAL DATA:  Small-bowel obstruction. EXAM: ABDOMEN - 1 VIEW COMPARISON:  10/09/2020.  CT 10/07/2020. FINDINGS: NG tube noted with tip and side hole over the stomach. Persistent mild small-bowel distention. Oral contrast again noted throughout the colon. Findings again consistent with partial small bowel obstruction. No free air. Surgical clips noted over the abdomen. Degenerative change thoracic spine scratched it degenerative changes of the thoracolumbar spine again noted. Total left hip replacement. Cardiac pacer leads again noted in stable position. IMPRESSION: 1.  NG tube noted with tip and side hole over the stomach. 2. Persistent small-bowel obstruction. Oral  contrast again noted throughout the colon. Findings again consistent with partial small bowel obstruction. Electronically Signed   By: Maisie Fus  Register   On: 10/10/2020 06:23   DG Chest Port 1 View  Result Date: 10/08/2020 CLINICAL DATA:  NG tube placement EXAM: PORTABLE CHEST 1 VIEW COMPARISON:  Earlier today. FINDINGS: The nasogastric tube tip is 10.7 cm below the GE junction. Left chest wall ICD noted with lead in the right ventricle. Stable cardiomediastinal contours. Diffuse reticular and nodular opacities are identified throughout both lungs and appear unchanged from previous exam. IMPRESSION: 1. No change in aeration to the lungs compared with previous exam. 2. Nasogastric tube tip is 10.7 cm below the GE junction. Electronically Signed   By: Signa Kell M.D.   On: 10/08/2020 19:29   DG Abd Portable 1V-Small Bowel Obstruction Protocol-initial, 8 hr delay  Result Date: 10/09/2020 CLINICAL DATA:  Follow up small bowel obstruction EXAM: PORTABLE ABDOMEN - 1 VIEW COMPARISON:  Film from earlier in the same day. FINDINGS: Gastric catheter is stable in appearance with the proximal side port at the gastroesophageal junction. Previously administered contrast material now lies throughout the colon. A few mildly dilated loops of small bowel are again seen consistent with partial small bowel obstruction. No free air is noted. No other focal abnormality is seen. IMPRESSION: Administered contrast now lies within the colon consistent with partial small bowel obstruction. Mild residual small bowel dilatation is seen. Electronically Signed   By: Alcide Clever M.D.   On: 10/09/2020 17:08  DG Abd Portable 1V-Small Bowel Protocol-Position Verification  Result Date: 10/09/2020 CLINICAL DATA:  NG tube placement EXAM: PORTABLE ABDOMEN - 1 VIEW COMPARISON:  None. FINDINGS: There are multiple mildly dilated loops of small bowel. There is an NG tube with side port overlying the region of the GE junction and tip  overlying the stomach. Degenerative changes of the spine. Prior hernia repair. Partially visualized left hip arthroplasty. IMPRESSION: NG tube side port overlies the region of the GE junction and tip overlies the stomach, consider advancing 3 cm. Multiple dilated loops of small bowel consistent with obstruction. Electronically Signed   By: Caprice Renshaw   On: 10/09/2020 11:08    Anti-infectives: Anti-infectives (From admission, onward)    None       Assessment/Plan: Mr. Gaza is a 80 yo w/ h/o pSBO that is starting to resolve but has not had BM. Continued dilation on KUB. Will put NG to gravity, let him do ice and sips today and KUB in AM.  ECHO for preoperative evaluation given history of CHF No recent CP or SOB Discussed potential need for surgery if not resolved by Friday but given some improvement will not proceed today Continue to hold his Xarelto, ok to have prophylactic lovenox/ heparin   LOS: 3 days    Lucretia Roers 10/10/2020

## 2020-10-10 NOTE — Progress Notes (Signed)
*  PRELIMINARY RESULTS* Echocardiogram 2D Echocardiogram has been performed.  Stacey Drain 10/10/2020, 4:28 PM

## 2020-10-10 NOTE — Progress Notes (Signed)
Bryce Cook  QIO:[id number] DOB: [birth year] DOA: 10/07/2020 PCP: Zachery Dauer, MD    Brief Narrative:  80 year old with a history of atrial fibrillation on Xarelto, CHF, HTN, HLD, gout, and prior SBO who presented to the ED with 24 hours of severe abdominal pain and frequent vomiting with a distended abdomen.  In the ED CBC was remarkable for leukocytosis and lipase was normal at 31.  CT abdomen pelvis noted partial small bowel obstruction in the proximal ileum likely due to adhesions from prior ventral hernia repair.  Significant Events:  6/19 admit via ED  Consultants:  General Surgery  Code Status: FULL CODE  Antimicrobials:  None  DVT prophylaxis: SCDs  Subjective: No fever, no chest pain, no palpitations, no nausea or vomiting.  Patient has not still MOVE BOWELS, BUT IS PASSING GAS. Ngt CLAMPED.   Assessment & Plan:  Partial small bowel obstruction -Continue IV fluid support and symptom management - -General Surgery following in consultation -electrolytes at goal, continue to monitor intermittently.  Hyperglycemia No known history of DM - likely simply stress reaction - A1c pending  HTN -Blood pressure controlled stable and well-controlled. -Continue to follow vital signs -As needed hydralazine.  HLD -Continue holding medical therapy until oral intake resumes  Chronic atrial fibrillation on Xarelto CHA2DS2-VASc is 4 - hold Xarelto in anticipation of possible need for surgery  -continue Coreg -Continue to follow clinical response; denies chest pain or palpitation.  History of chronic systolic CHF -Appears to have resolved with most recent EF 60% via TTE -uvolemic at present  Gout -Experiencing potential acute flare of wrist  -short course of colchicine in attempt to stop the acute flare; if needed will provide treatment with steroids.   Family Communication: No family present at time of exam  Status is: Inpatient  Remains inpatient appropriate  because:Inpatient level of care appropriate due to severity of illness  Dispo: The patient is from: Home              Anticipated d/c is to: Home              Patient currently is not medically stable to d/c.   Difficult to place patient No   Objective: Blood pressure (!) 152/71, pulse 76, temperature 98.2 F (36.8 C), temperature source Oral, resp. rate 20, height 5\' 10"  (1.778 m), weight 110.6 kg, SpO2 98 %.  Intake/Output Summary (Last 24 hours) at 10/10/2020 1410 Last data filed at 10/09/2020 2245 Gross per 24 hour  Intake --  Output 650 ml  Net -650 ml   Filed Weights   10/08/20 0817 10/09/20 0500 10/10/20 0317  Weight: 108.9 kg 111 kg 110.6 kg    Examination: General exam: Alert, awake, oriented x 3; NG tube clamped and in place; reports no abdominal pain, no nausea, no vomiting.  Still without bowel movement.  Passing gas. Respiratory system: Clear to auscultation. Respiratory effort normal.  2 L nasal cannula in place.  No using accessory muscle. Cardiovascular system:RRR. No murmurs, rubs, gallops. Gastrointestinal system: Abdomen is obese, mildly distended; nontender to palpation. No organomegaly or masses felt.  No bowel sounds heard. Central nervous system: Alert and oriented. No focal neurological deficits. Extremities: No C/C/E, +pedal pulses Skin: No rashes, lesions or ulcers Psychiatry: Judgement and insight appear normal. Mood & affect appropriate.     CBC: Recent Labs  Lab 10/07/20 1957 10/08/20 0517 10/09/20 0451  WBC 13.6* 12.7* 11.1*  HGB 14.5 14.3 12.7*  HCT 44.8 45.4 40.4  MCV 92.2 93.4 95.7  PLT 287 273 236   Basic Metabolic Panel: Recent Labs  Lab 10/07/20 1957 10/08/20 0517 10/09/20 0451  NA 138 140 138  K 3.8 4.0 4.1  CL 100 102 105  CO2 27 30 26   GLUCOSE 138* 130* 116*  BUN 23 21 20   CREATININE 0.85 0.89 0.82  CALCIUM 9.0 9.2 8.5*  MG  --  2.0 2.1  PHOS  --  3.4 2.3*   GFR: Estimated Creatinine Clearance: 90.9 mL/min (by C-G  formula based on SCr of 0.82 mg/dL).  Liver Function Tests: Recent Labs  Lab 10/07/20 1957 10/08/20 0517 10/09/20 0451  AST 29 26 24   ALT 44 38 29  ALKPHOS 93 85 77  BILITOT 0.7 0.7 1.1  PROT 7.4 7.1 6.8  ALBUMIN 3.4* 3.2* 3.1*   Recent Labs  Lab 10/07/20 1957  LIPASE 31    Coagulation Profile: Recent Labs  Lab 10/08/20 0517  INR 1.5*    Scheduled Meds:  allopurinol  100 mg Oral Daily   bisacodyl  10 mg Rectal BID   carvedilol  25 mg Oral Daily   colchicine  0.6 mg Oral Daily      LOS: 3 days   MD  [facility] Office  [phone] Pager - Text Page per 10/10/20  If 7PM-7AM, please contact night-coverage per Amion 10/10/2020, 2:10 PM

## 2020-10-11 ENCOUNTER — Inpatient Hospital Stay (HOSPITAL_COMMUNITY): Payer: Medicare PPO

## 2020-10-11 DIAGNOSIS — K56609 Unspecified intestinal obstruction, unspecified as to partial versus complete obstruction: Secondary | ICD-10-CM

## 2020-10-11 DIAGNOSIS — Z6835 Body mass index (BMI) 35.0-35.9, adult: Secondary | ICD-10-CM

## 2020-10-11 DIAGNOSIS — G4733 Obstructive sleep apnea (adult) (pediatric): Secondary | ICD-10-CM

## 2020-10-11 MED ORDER — BISACODYL 10 MG RE SUPP: 10.0000 mg | Freq: Every day | RECTAL | 0 refills | Status: AC | PRN

## 2020-10-11 NOTE — Discharge Summary (Signed)
Physician Discharge Summary  Bryce Cook SWN:[id number] DOB: Nov 24, 1940 DOA: 10/07/2020  PCP: Zachery Dauer, MD  Admit date: 10/07/2020 Discharge date: 10/11/2020  Time spent: 35 minutes  Recommendations for Outpatient Follow-up:  Please make sure patient has follow-up with cardiology service as instructed. Repeat basic metabolic panel to evaluate lites renal function. Reassess patient's CBGs and initiate treatment for new diagnosis type 2 diabetes. Repeat CBC to follow hemoglobin and stability.   Discharge Diagnoses:  Principal Problem:   Partial small bowel obstruction (HCC) Active Problems:   Essential hypertension   Hyperlipidemia   Abdominal pain   A-fib (HCC)   Chronic systolic CHF (congestive heart failure) (HCC)   Leukocytosis   Hyperglycemia   Class 2 severe obesity due to excess calories with serious comorbidity and body mass index (BMI) of 35.0 to 35.9 in adult (HCC)   OSA (obstructive sleep apnea)   Discharge Condition: Stable and improved.  Discharged home with instruction to follow-up with PCP in 10 days and to follow-up with cardiology service as an outpatient.  CODE STATUS: Full code.  Diet recommendation: Heart healthy, low calorie and modify carbohydrate diet.  Filed Weights   10/09/20 0500 10/10/20 0317 10/11/20 0500  Weight: 111 kg 110.6 kg 110.7 kg    History of present illness:  80 year old with a history of atrial fibrillation on Xarelto, CHF, HTN, HLD, gout, and prior SBO who presented to the ED with 24 hours of severe abdominal pain and frequent vomiting with a distended abdomen.  In the ED CBC was remarkable for leukocytosis and lipase was normal at 31.  CT abdomen pelvis noted partial small bowel obstruction in the proximal ileum likely due to adhesions from prior ventral hernia repair.  Hospital Course:  Partial small bowel obstruction -Advised to maintain adequate hydration -No abdominal pain, no nausea, no vomiting and tolerating  diet. -Patient instructed to continue to slowly advancing his diet consistency and to follow bowel regimen instructions as recommended by general surgery. -Follow-up with PCP in 10 days.   Hyperglycemia/type 2 diabetes (new diagnosis). -No known history of DM - likely simply stress reaction  -A1c 7.2 -Patient reports that he would like to follow-up with PCP before initiating any treatment for blood sugar control. -Modify carbohydrate diet and weight loss recommended.  Class II obesity -Body mass index is 35.02 kg/m. -Low calorie diet, portion control and increase physical activity discussed with patient.   HTN -Stable and well-controlled -Resume home antihypertensive agents. -Advised to follow heart healthy diet.   HLD -Resume statins.   Chronic atrial fibrillation on Xarelto -CHA2DS2-VASc is 4  -continue Coreg -Patient denies palpitations -Resume oral anticoagulation.   History of chronic systolic CHF -Euvolemic and compensated at present; no complaints of shortness of breath or orthopnea. -Resume home therapy and outpatient follow-up with cardiology service. -2D echo on 10/10/2020 demonstrating ejection fraction of 30 to 35%. -Patient advised to follow low-sodium diet, to check his weight on daily basis and to follow-up with cardiology service.   Gout -Experiencing presumed acute flare of left wrist -short course of colchicine in attempt to stop the acute flare provided; at discharge no complaining of any pain, still having mild swelling. -Continue the use of colchicine -Keep limb elevated -No signs of superimposed infection. -If needed will recommend provide treatment with steroids. -Patient advised to maintain adequate hydration.    Procedures: See below for x-ray report NG tube placement 2D echo:  1. Left ventricular ejection fraction, by estimation, is 30 to 35%. The  left ventricle has moderately decreased function. The left ventricle  demonstrates global  hypokinesis. The left ventricular internal cavity size  was moderately dilated. Left  ventricular diastolic parameters are indeterminate.   2. Right ventricular systolic function is normal. The right ventricular  size is normal. There is moderately elevated pulmonary artery systolic  pressure.   3. Left atrial size was moderately dilated.   4. Pacing wire in RA.   5. The mitral valve is normal in structure. Mild mitral valve  regurgitation. No evidence of mitral stenosis.   6. The aortic valve is tricuspid. There is moderate calcification of the  aortic valve. There is moderate thickening of the aortic valve. Aortic  valve regurgitation is not visualized. Sclerosis no Stenosis.   7. The inferior vena cava is dilated in size with >50% respiratory  variability, suggesting right atrial pressure of 8 mmHg.   Consultations: General surgery  Discharge Exam: Vitals:   10/11/20 0815 10/11/20 1401  BP: (!) 154/84 138/81  Pulse: 95 (!) 48  Resp: 20 19  Temp: 98.1 F (36.7 C) 98.4 F (36.9 C)  SpO2: 96% 93%    General: No fever, no chest pain, no nausea, no vomiting.  Denies abdominal pain, multiple bowel movements reported feeling ready to go home. Cardiovascular: Rate controlled, no rubs, no gallops. Respiratory: Good air movement bilaterally, no wheezing or crackles.  Positive scattered rhonchi. Abdomen: Obese, nontender, no guarding, positive bowel sounds. Extremities: No cyanosis or clubbing; left wrist/hand swelling nontender on palpation good range of motion.  Discharge Instructions   Discharge Instructions     (HEART FAILURE PATIENTS) Call MD:  Anytime you have any of the following symptoms: 1) 3 pound weight gain in 24 hours or 5 pounds in 1 week 2) shortness of breath, with or without a dry hacking cough 3) swelling in the hands, feet or stomach 4) if you have to sleep on extra pillows at night in order to breathe.   Complete by: As directed    Diet - low sodium heart  healthy   Complete by: As directed    Discharge instructions   Complete by: As directed    Follow soft diet -Take medication as prescribed Maintain adequate hydration Follow-up with PCP in 10 days. Check your weight on daily basis and monitor amount of sodium intake      Allergies as of 10/11/2020       Reactions   Amiodarone Other (See Comments)   blister   Lipitor [atorvastatin] Other (See Comments)   "body tightness"  "couldn't get out of the bed"   Sotalol Rash        Medication List     TAKE these medications    acetaminophen 325 MG tablet Commonly known as: TYLENOL Take 2 tablets (650 mg total) by mouth every 6 (six) hours as needed for mild pain, fever or headache.   allopurinol 100 MG tablet Commonly known as: ZYLOPRIM Take 100 mg by mouth daily.   bisacodyl 10 MG suppository Commonly known as: DULCOLAX Place 1 suppository (10 mg total) rectally daily as needed for moderate constipation.   carvedilol 25 MG tablet Commonly known as: COREG Take 25 mg by mouth daily.   cetirizine 10 MG tablet Commonly known as: ZYRTEC Take 10 mg by mouth daily as needed for allergies.   furosemide 80 MG tablet Commonly known as: LASIX Take 80 mg by mouth daily.   lisinopril 20 MG tablet Commonly known as: ZESTRIL Take 20 mg by mouth daily.  Ofev 150 MG Caps Generic drug: Nintedanib Take 150 mg by mouth 2 (two) times daily.   polyethylene glycol 17 g packet Commonly known as: MIRALAX / GLYCOLAX Take 17 g by mouth daily as needed for mild constipation.   rivaroxaban 20 MG Tabs tablet Commonly known as: XARELTO Take 1 tablet by mouth at bedtime.   rosuvastatin 10 MG tablet Commonly known as: CRESTOR Take 10 mg by mouth daily.       Allergies  Allergen Reactions   Amiodarone Other (See Comments)    blister   Lipitor [Atorvastatin] Other (See Comments)    "body tightness"  "couldn't get out of the bed"   Sotalol Rash    Follow-up Information      Zachery Dauer, MD. Schedule an appointment as soon as possible for a visit in 10 day(s).   Specialty: Family Medicine Contact information: [address], [place] [address] [phone]                  The results of significant diagnostics from this hospitalization (including imaging, microbiology, ancillary and laboratory) are listed below for reference.    Significant Diagnostic Studies: CT ABDOMEN PELVIS WO CONTRAST  Result Date: 10/07/2020 CLINICAL DATA:  Abdominal distension for 2 days EXAM: CT ABDOMEN AND PELVIS WITHOUT CONTRAST TECHNIQUE: Multidetector CT imaging of the abdomen and pelvis was performed following the standard protocol without IV contrast. COMPARISON:  04/01/2018 FINDINGS: Lower chest: Progressive scarring is noted in the lower lobes bilaterally. No sizable effusion or focal confluent infiltrate is seen. Defibrillator is noted. Hepatobiliary: No focal liver abnormality is seen. No gallstones, gallbladder wall thickening, or biliary dilatation. Pancreas: Unremarkable. No pancreatic ductal dilatation or surrounding inflammatory changes. Spleen: Normal in size without focal abnormality. Adrenals/Urinary Tract: Adrenal glands are within normal limits. Kidneys are well visualized bilaterally. No renal calculi or obstructive changes are seen. The ureters are within normal limits. Bladder is well distended. Stomach/Bowel: Diverticular change of the colon is noted without significant diverticulitis. No obstructive changes are seen. The appendix has been surgically removed. Small bowel demonstrates mild proximal dilatation consistent with partial small bowel obstruction. Some fecalization of small bowel contents is noted. These loops of jejunum and likely proximal ileum are dilated to a an area of adhesion intimately related to the posterior aspect of the anterior abdominal wall where prior hernia repair was performed. The more distal small bowel is within normal  limits. Transition zone is best seen on the coronal imaging image 21 of series 5 and the axial image number 68 of series 2. The more distal small bowel is within normal limits. Vascular/Lymphatic: Aortic atherosclerosis. No enlarged abdominal or pelvic lymph nodes. Reproductive: Prostate is unremarkable. Other: No abdominal wall hernia or abnormality. No abdominopelvic ascites. Musculoskeletal: Left hip prosthesis is noted. Degenerative changes of lumbar spine are seen. IMPRESSION: Changes consistent with partial small bowel obstruction in the proximal ileum secondary to adhesions from prior ventral hernia repair. Progressive scarring in the lung bases bilaterally. No acute infiltrate is seen. Electronically Signed   By: Alcide Clever M.D.   On: 10/07/2020 21:54   DG Chest 1 View  Result Date: 10/11/2020 CLINICAL DATA:  Nasogastric tube placement EXAM: CHEST  1 VIEW COMPARISON:  10/08/2020 FINDINGS: Nasogastric tube tip overlies the expected proximal to mid body of the stomach. The visualized abdominal gas pattern is unremarkable. IMPRESSION: Nasogastric tube tip within the proximal to mid body of the stomach. Electronically Signed   By: Helyn Numbers  MD   On: 10/11/2020 04:14   DG Abd 1 View  Result Date: 10/11/2020 CLINICAL DATA:  Small-bowel obstruction. EXAM: ABDOMEN - 1 VIEW COMPARISON:  Abdomen 10/10/2020.  CT 10/07/2020. FINDINGS: NG tube in stable position with tip and side hole of the upper stomach. Persistent dilated loops of small bowel. Oral contrast again noted the colon. Findings again consistent with persistent partial small bowel obstruction. No free air. Degenerative change lumbar spine. Total left hip replacement. Surgical clips noted over the lower abdomen. Pacemaker lead in stable position. Bibasilar atelectasis/infiltrates again noted. IMPRESSION: 1.  NG tube in stable position. 2. Persistent dilated loops of small bowel. Oral contrast again noted the colon. Findings again consistent  persistent small-bowel obstruction. No interim change. Electronically Signed   By: Maisie Fus  Register   On: 10/11/2020 06:22   DG Abd 1 View  Result Date: 10/10/2020 CLINICAL DATA:  Small-bowel obstruction. EXAM: ABDOMEN - 1 VIEW COMPARISON:  10/09/2020.  CT 10/07/2020. FINDINGS: NG tube noted with tip and side hole over the stomach. Persistent mild small-bowel distention. Oral contrast again noted throughout the colon. Findings again consistent with partial small bowel obstruction. No free air. Surgical clips noted over the abdomen. Degenerative change thoracic spine scratched it degenerative changes of the thoracolumbar spine again noted. Total left hip replacement. Cardiac pacer leads again noted in stable position. IMPRESSION: 1.  NG tube noted with tip and side hole over the stomach. 2. Persistent small-bowel obstruction. Oral contrast again noted throughout the colon. Findings again consistent with partial small bowel obstruction. Electronically Signed   By: Maisie Fus  Register   On: 10/10/2020 06:23   DG Chest Port 1 View  Result Date: 10/08/2020 CLINICAL DATA:  NG tube placement EXAM: PORTABLE CHEST 1 VIEW COMPARISON:  Earlier today. FINDINGS: The nasogastric tube tip is 10.7 cm below the GE junction. Left chest wall ICD noted with lead in the right ventricle. Stable cardiomediastinal contours. Diffuse reticular and nodular opacities are identified throughout both lungs and appear unchanged from previous exam. IMPRESSION: 1. No change in aeration to the lungs compared with previous exam. 2. Nasogastric tube tip is 10.7 cm below the GE junction. Electronically Signed   By: Signa Kell M.D.   On: 10/08/2020 19:29   DG Chest Portable 1 View  Result Date: 10/08/2020 CLINICAL DATA:  Evaluate NG tube placement EXAM: PORTABLE CHEST 1 VIEW COMPARISON:  04/03/2018 FINDINGS: Left chest wall ICD is noted with lead in the right ventricle. There is a nasogastric tube with tip and side port below the level of  the GE junction. There are diffuse reticulonodular interstitial past these identified throughout both lungs. No focal airspace consolidation identified. The visualized osseous structures are unremarkable. IMPRESSION: Satisfactory position of nasogastric tube with tip and side port below the level of the GE junction. Electronically Signed   By: Signa Kell M.D.   On: 10/08/2020 00:27   DG Abd Portable 1V-Small Bowel Obstruction Protocol-initial, 8 hr delay  Result Date: 10/09/2020 CLINICAL DATA:  Follow up small bowel obstruction EXAM: PORTABLE ABDOMEN - 1 VIEW COMPARISON:  Film from earlier in the same day. FINDINGS: Gastric catheter is stable in appearance with the proximal side port at the gastroesophageal junction. Previously administered contrast material now lies throughout the colon. A few mildly dilated loops of small bowel are again seen consistent with partial small bowel obstruction. No free air is noted. No other focal abnormality is seen. IMPRESSION: Administered contrast now lies within the colon consistent with partial small bowel  obstruction. Mild residual small bowel dilatation is seen. Electronically Signed   By: Alcide Clever M.D.   On: 10/09/2020 17:08   DG Abd Portable 1V-Small Bowel Protocol-Position Verification  Result Date: 10/09/2020 CLINICAL DATA:  NG tube placement EXAM: PORTABLE ABDOMEN - 1 VIEW COMPARISON:  None. FINDINGS: There are multiple mildly dilated loops of small bowel. There is an NG tube with side port overlying the region of the GE junction and tip overlying the stomach. Degenerative changes of the spine. Prior hernia repair. Partially visualized left hip arthroplasty. IMPRESSION: NG tube side port overlies the region of the GE junction and tip overlies the stomach, consider advancing 3 cm. Multiple dilated loops of small bowel consistent with obstruction. Electronically Signed   By: Caprice Renshaw   On: 10/09/2020 11:08   ECHOCARDIOGRAM COMPLETE  Result Date:  10/10/2020    ECHOCARDIOGRAM REPORT   Patient Name:   Bryce Cook Date of Exam: 10/10/2020 Medical Rec #:  [id number]     Height:       70.0 in Accession #:    [id number]    Weight:       243.8 lb Date of Birth:  09-Dec-1940     BSA:          2.271 m Patient Age:    80 years      BP:           152/71 mmHg Patient Gender: M             HR:           76 bpm. [facility] Procedure: 2D Echo, Cardiac Doppler and Color Doppler Indications:    Preoperative evaluation  History:        Patient has no prior history of Echocardiogram examinations. CHF                 and Cardiomyopathy, Arrythmias:Atrial Fibrillation; Risk                 Factors:Hypertension and Dyslipidemia.  Sonographer:    Celesta Gentile RCS Referring Phys: [id number] LINDSAY C BRIDGES IMPRESSIONS  1. Left ventricular ejection fraction, by estimation, is 30 to 35%. The left ventricle has moderately decreased function. The left ventricle demonstrates global hypokinesis. The left ventricular internal cavity size was moderately dilated. Left ventricular diastolic parameters are indeterminate.  2. Right ventricular systolic function is normal. The right ventricular size is normal. There is moderately elevated pulmonary artery systolic pressure.  3. Left atrial size was moderately dilated.  4. Pacing wire in RA.  5. The mitral valve is normal in structure. Mild mitral valve regurgitation. No evidence of mitral stenosis.  6. The aortic valve is tricuspid. There is moderate calcification of the aortic valve. There is moderate thickening of the aortic valve. Aortic valve regurgitation is not visualized. Sclerosis no Stenosis.  7. The inferior vena cava is dilated in size with >50% respiratory variability, suggesting right atrial pressure of 8 mmHg. FINDINGS  Left Ventricle: Left ventricular ejection fraction, by estimation, is 30 to 35%. The left ventricle has moderately decreased function. The left ventricle demonstrates global hypokinesis. The left  ventricular internal cavity size was moderately dilated. There is no left ventricular hypertrophy. Left ventricular diastolic parameters are indeterminate. Right Ventricle: The right ventricular size is normal. No increase in right ventricular wall thickness. Right ventricular systolic function is normal. There is moderately elevated pulmonary artery systolic pressure. The tricuspid regurgitant velocity is 3.33 m/s, and with an  assumed right atrial pressure of 8 mmHg, the estimated right ventricular systolic pressure is 52.4 mmHg. Left Atrium: Left atrial size was moderately dilated. Right Atrium: Pacing wire in RA. Right atrial size was normal in size. Pericardium: There is no evidence of pericardial effusion. Mitral Valve: The mitral valve is normal in structure. Mild mitral valve regurgitation. No evidence of mitral valve stenosis. Tricuspid Valve: The tricuspid valve is normal in structure. Tricuspid valve regurgitation is mild . No evidence of tricuspid stenosis. Aortic Valve: The aortic valve is tricuspid. There is moderate calcification of the aortic valve. There is moderate thickening of the aortic valve. Aortic valve regurgitation is not visualized. Sclerosis no Stenosis. Pulmonic Valve: The pulmonic valve was normal in structure. Pulmonic valve regurgitation is not visualized. No evidence of pulmonic stenosis. Aorta: The aortic root is normal in size and structure. Venous: The inferior vena cava is dilated in size with greater than 50% respiratory variability, suggesting right atrial pressure of 8 mmHg. IAS/Shunts: No atrial level shunt detected by color flow Doppler. Additional Comments: A device lead is visualized.  LEFT VENTRICLE PLAX 2D LVIDd:         6.30 cm LVIDs:         5.10 cm LV PW:         1.00 cm LV IVS:        0.90 cm LVOT diam:     2.20 cm LV SV:         53 LV SV Index:   23 LVOT Area:     3.80 cm  LV Volumes (MOD) LV vol d, MOD A2C: 197.0 ml LV vol d, MOD A4C: 194.0 ml LV vol s, MOD A2C:  111.0 ml LV vol s, MOD A4C: 146.0 ml LV SV MOD A2C:     86.0 ml LV SV MOD A4C:     194.0 ml LV SV MOD BP:      73.7 ml RIGHT VENTRICLE RV S prime:     10.80 cm/s TAPSE (M-mode): 2.0 cm LEFT ATRIUM              Index       RIGHT ATRIUM           Index LA diam:        4.80 cm  2.11 cm/m  RA Area:     36.00 cm LA Vol (A2C):   170.0 ml 74.87 ml/m RA Volume:   146.00 ml 64.30 ml/m LA Vol (A4C):   118.0 ml 51.97 ml/m LA Biplane Vol: 152.0 ml 66.94 ml/m  AORTIC VALVE LVOT Vmax:   66.45 cm/s LVOT Vmean:  44.700 cm/s LVOT VTI:    0.140 m  AORTA Ao Root diam: 3.80 cm MITRAL VALVE               TRICUSPID VALVE MV Area (PHT): 4.26 cm    TR Peak grad:   44.4 mmHg MV Decel Time: 178 msec    TR Vmax:        333.00 cm/s MR Peak grad: 86.9 mmHg MR Mean grad: 61.0 mmHg    SHUNTS MR Vmax:      466.00 cm/s  Systemic VTI:  0.14 m MR Vmean:     374.0 cm/s   Systemic Diam: 2.20 cm MV E velocity: 94.30 cm/s Charlton Haws MD Electronically signed by Charlton Haws MD Signature Date/Time: 10/10/2020/4:44:23 PM    Final     Microbiology: Recent Results (from the past 240 hour(s))  SARS CORONAVIRUS 2 (TAT  6-24 HRS) Nasopharyngeal Nasopharyngeal Swab     Status: None   Collection Time: 10/07/20  8:50 PM   Specimen: Nasopharyngeal Swab  Result Value Ref Range Status   SARS Coronavirus 2 NEGATIVE NEGATIVE Final    Comment: (NOTE) SARS-CoV-2 target nucleic acids are NOT DETECTED.  The SARS-CoV-2 RNA is generally detectable in upper and lower respiratory specimens during the acute phase of infection. Negative results do not preclude SARS-CoV-2 infection, do not rule out co-infections with other pathogens, and should not be used as the sole basis for treatment or other patient management decisions. Negative results must be combined with clinical observations, patient history, and epidemiological information. The expected result is Negative.  Fact Sheet for Patients: HairSlick.no  Fact Sheet for  Healthcare Providers: quierodirigir.com  This test is not yet approved or cleared by the [place] FDA and  has been authorized for detection and/or diagnosis of SARS-CoV-2 by FDA under an Emergency Use Authorization (EUA). This EUA will remain  in effect (meaning this test can be used) for the duration of the COVID-19 declaration under Se ction 564(b)(1) of the Act, 21 U.S.C. section 360bbb-3(b)(1), unless the authorization is terminated or revoked sooner.  Performed at [facility], [address], [place], [place] [address]      Labs: Basic Metabolic Panel: Recent Labs  Lab 10/07/20 1957 10/08/20 0517 10/09/20 0451  NA 138 140 138  K 3.8 4.0 4.1  CL 100 102 105  CO2 27 30 26   GLUCOSE 138* 130* 116*  BUN 23 21 20   CREATININE 0.85 0.89 0.82  CALCIUM 9.0 9.2 8.5*  MG  --  2.0 2.1  PHOS  --  3.4 2.3*   Liver Function Tests: Recent Labs  Lab 10/07/20 1957 10/08/20 0517 10/09/20 0451  AST 29 26 24   ALT 44 38 29  ALKPHOS 93 85 77  BILITOT 0.7 0.7 1.1  PROT 7.4 7.1 6.8  ALBUMIN 3.4* 3.2* 3.1*   Recent Labs  Lab 10/07/20 1957  LIPASE 31   CBC: Recent Labs  Lab 10/07/20 1957 10/08/20 0517 10/09/20 0451  WBC 13.6* 12.7* 11.1*  HGB 14.5 14.3 12.7*  HCT 44.8 45.4 40.4  MCV 92.2 93.4 95.7  PLT 287 273 236    Signed:  Vassie Loll MD.  [facility] 10/11/2020, 2:05 PM

## 2020-10-11 NOTE — Progress Notes (Signed)
Patient stated that his NG tube felt like it had moved in his nose. New bandage placed on nose. Put in order for chest xray to confirm placement is still okay. Will continue to monitor.

## 2020-10-11 NOTE — Progress Notes (Signed)
[facility] Progress Note     Subjective: Multiple BM this Am.. Wanting diet.   Objective: Vital signs in last 24 hours: Temp:  [97.8 F (36.6 C)-98.2 F (36.8 C)] 98.1 F (36.7 C) (06/23 0815) Pulse Rate:  [76-95] 95 (06/23 0815) Resp:  [19-24] 20 (06/23 0815) BP: (133-156)/(71-84) 154/84 (06/23 0815) SpO2:  [93 %-98 %] 96 % (06/23 0815) Weight:  [110.7 kg] 110.7 kg (06/23 0500) Last BM Date: 10/11/20  Intake/Output from previous day: 06/22 0701 - 06/23 0700 In: -  Out: 1250 [Emesis/NG output:1250] Intake/Output this shift: No intake/output data recorded.  General appearance: alert, cooperative, and no distress Resp: normal work of breathing GI: soft, mildly distended, nontender  Lab Results:  Recent Labs    10/09/20 0451  WBC 11.1*  HGB 12.7*  HCT 40.4  PLT 236   BMET Recent Labs    10/09/20 0451  NA 138  K 4.1  CL 105  CO2 26  GLUCOSE 116*  BUN 20  CREATININE 0.82  CALCIUM 8.5*   PT/INR No results for input(s): LABPROT, INR in the last 72 hours.  Studies/Results: DG Chest 1 View  Result Date: 10/11/2020 CLINICAL DATA:  Nasogastric tube placement EXAM: CHEST  1 VIEW COMPARISON:  10/08/2020 FINDINGS: Nasogastric tube tip overlies the expected proximal to mid body of the stomach. The visualized abdominal gas pattern is unremarkable. IMPRESSION: Nasogastric tube tip within the proximal to mid body of the stomach. Electronically Signed   By: Helyn Numbers MD   On: 10/11/2020 04:14   DG Abd 1 View  Result Date: 10/11/2020 CLINICAL DATA:  Small-bowel obstruction. EXAM: ABDOMEN - 1 VIEW COMPARISON:  Abdomen 10/10/2020.  CT 10/07/2020. FINDINGS: NG tube in stable position with tip and side hole of the upper stomach. Persistent dilated loops of small bowel. Oral contrast again noted the colon. Findings again consistent with persistent partial small bowel obstruction. No free air. Degenerative change lumbar spine. Total left hip replacement.  Surgical clips noted over the lower abdomen. Pacemaker lead in stable position. Bibasilar atelectasis/infiltrates again noted. IMPRESSION: 1.  NG tube in stable position. 2. Persistent dilated loops of small bowel. Oral contrast again noted the colon. Findings again consistent persistent small-bowel obstruction. No interim change. Electronically Signed   By: Maisie Fus  Register   On: 10/11/2020 06:22   DG Abd 1 View  Result Date: 10/10/2020 CLINICAL DATA:  Small-bowel obstruction. EXAM: ABDOMEN - 1 VIEW COMPARISON:  10/09/2020.  CT 10/07/2020. FINDINGS: NG tube noted with tip and side hole over the stomach. Persistent mild small-bowel distention. Oral contrast again noted throughout the colon. Findings again consistent with partial small bowel obstruction. No free air. Surgical clips noted over the abdomen. Degenerative change thoracic spine scratched it degenerative changes of the thoracolumbar spine again noted. Total left hip replacement. Cardiac pacer leads again noted in stable position. IMPRESSION: 1.  NG tube noted with tip and side hole over the stomach. 2. Persistent small-bowel obstruction. Oral contrast again noted throughout the colon. Findings again consistent with partial small bowel obstruction. Electronically Signed   By: Maisie Fus  Register   On: 10/10/2020 06:23   DG Abd Portable 1V-Small Bowel Obstruction Protocol-initial, 8 hr delay  Result Date: 10/09/2020 CLINICAL DATA:  Follow up small bowel obstruction EXAM: PORTABLE ABDOMEN - 1 VIEW COMPARISON:  Film from earlier in the same day. FINDINGS: Gastric catheter is stable in appearance with the proximal side port at the gastroesophageal junction. Previously administered contrast material now lies throughout the colon.  A few mildly dilated loops of small bowel are again seen consistent with partial small bowel obstruction. No free air is noted. No other focal abnormality is seen. IMPRESSION: Administered contrast now lies within the colon  consistent with partial small bowel obstruction. Mild residual small bowel dilatation is seen. Electronically Signed   By: Alcide Clever M.D.   On: 10/09/2020 17:08   ECHOCARDIOGRAM COMPLETE  Result Date: 10/10/2020    ECHOCARDIOGRAM REPORT   Patient Name:   Bryce Cook Date of Exam: 10/10/2020 Medical Rec #:  [id number]     Height:       70.0 in Accession #:    [id number]    Weight:       243.8 lb Date of Birth:  09/30/1940     BSA:          2.271 m Patient Age:    80 years      BP:           152/71 mmHg Patient Gender: M             HR:           76 bpm. [facility] Procedure: 2D Echo, Cardiac Doppler and Color Doppler Indications:    Preoperative evaluation  History:        Patient has no prior history of Echocardiogram examinations. CHF                 and Cardiomyopathy, Arrythmias:Atrial Fibrillation; Risk                 Factors:Hypertension and Dyslipidemia.  Sonographer:    Celesta Gentile RCS Referring Phys: [id number] Allora Bains C Baruc Tugwell IMPRESSIONS  1. Left ventricular ejection fraction, by estimation, is 30 to 35%. The left ventricle has moderately decreased function. The left ventricle demonstrates global hypokinesis. The left ventricular internal cavity size was moderately dilated. Left ventricular diastolic parameters are indeterminate.  2. Right ventricular systolic function is normal. The right ventricular size is normal. There is moderately elevated pulmonary artery systolic pressure.  3. Left atrial size was moderately dilated.  4. Pacing wire in RA.  5. The mitral valve is normal in structure. Mild mitral valve regurgitation. No evidence of mitral stenosis.  6. The aortic valve is tricuspid. There is moderate calcification of the aortic valve. There is moderate thickening of the aortic valve. Aortic valve regurgitation is not visualized. Sclerosis no Stenosis.  7. The inferior vena cava is dilated in size with >50% respiratory variability, suggesting right atrial pressure of 8 mmHg.  FINDINGS  Left Ventricle: Left ventricular ejection fraction, by estimation, is 30 to 35%. The left ventricle has moderately decreased function. The left ventricle demonstrates global hypokinesis. The left ventricular internal cavity size was moderately dilated. There is no left ventricular hypertrophy. Left ventricular diastolic parameters are indeterminate. Right Ventricle: The right ventricular size is normal. No increase in right ventricular wall thickness. Right ventricular systolic function is normal. There is moderately elevated pulmonary artery systolic pressure. The tricuspid regurgitant velocity is 3.33 m/s, and with an assumed right atrial pressure of 8 mmHg, the estimated right ventricular systolic pressure is 52.4 mmHg. Left Atrium: Left atrial size was moderately dilated. Right Atrium: Pacing wire in RA. Right atrial size was normal in size. Pericardium: There is no evidence of pericardial effusion. Mitral Valve: The mitral valve is normal in structure. Mild mitral valve regurgitation. No evidence of mitral valve stenosis. Tricuspid Valve: The tricuspid valve is normal in structure.  Tricuspid valve regurgitation is mild . No evidence of tricuspid stenosis. Aortic Valve: The aortic valve is tricuspid. There is moderate calcification of the aortic valve. There is moderate thickening of the aortic valve. Aortic valve regurgitation is not visualized. Sclerosis no Stenosis. Pulmonic Valve: The pulmonic valve was normal in structure. Pulmonic valve regurgitation is not visualized. No evidence of pulmonic stenosis. Aorta: The aortic root is normal in size and structure. Venous: The inferior vena cava is dilated in size with greater than 50% respiratory variability, suggesting right atrial pressure of 8 mmHg. IAS/Shunts: No atrial level shunt detected by color flow Doppler. Additional Comments: A device lead is visualized.  LEFT VENTRICLE PLAX 2D LVIDd:         6.30 cm LVIDs:         5.10 cm LV PW:          1.00 cm LV IVS:        0.90 cm LVOT diam:     2.20 cm LV SV:         53 LV SV Index:   23 LVOT Area:     3.80 cm  LV Volumes (MOD) LV vol d, MOD A2C: 197.0 ml LV vol d, MOD A4C: 194.0 ml LV vol s, MOD A2C: 111.0 ml LV vol s, MOD A4C: 146.0 ml LV SV MOD A2C:     86.0 ml LV SV MOD A4C:     194.0 ml LV SV MOD BP:      73.7 ml RIGHT VENTRICLE RV S prime:     10.80 cm/s TAPSE (M-mode): 2.0 cm LEFT ATRIUM              Index       RIGHT ATRIUM           Index LA diam:        4.80 cm  2.11 cm/m  RA Area:     36.00 cm LA Vol (A2C):   170.0 ml 74.87 ml/m RA Volume:   146.00 ml 64.30 ml/m LA Vol (A4C):   118.0 ml 51.97 ml/m LA Biplane Vol: 152.0 ml 66.94 ml/m  AORTIC VALVE LVOT Vmax:   66.45 cm/s LVOT Vmean:  44.700 cm/s LVOT VTI:    0.140 m  AORTA Ao Root diam: 3.80 cm MITRAL VALVE               TRICUSPID VALVE MV Area (PHT): 4.26 cm    TR Peak grad:   44.4 mmHg MV Decel Time: 178 msec    TR Vmax:        333.00 cm/s MR Peak grad: 86.9 mmHg MR Mean grad: 61.0 mmHg    SHUNTS MR Vmax:      466.00 cm/s  Systemic VTI:  0.14 m MR Vmean:     374.0 cm/s   Systemic Diam: 2.20 cm MV E velocity: 94.30 cm/s Charlton Haws MD Electronically signed by Charlton Haws MD Signature Date/Time: 10/10/2020/4:44:23 PM    Final     Anti-infectives: Anti-infectives (From admission, onward)    None       Assessment/Plan: Mr. Wentzell is a SBO that is resolving. Overall improving.  Clear diet now  and adv as tolerated Can follow up with PCP  ECHO with EF 30-35% so if needed surgery would need preoperative evaluation.    LOS: 4 days    Lucretia Roers 10/11/2020

## 2020-10-11 NOTE — Progress Notes (Signed)
Patient had BM

## 2020-10-12 SURGERY — LAPAROTOMY, EXPLORATORY
Anesthesia: General

## 2022-02-19 DEATH — deceased

## 2022-11-15 IMAGING — DX DG ABDOMEN 1V
2 series · 2 of 2 positions shown · non-contrast
Comparison: Abdomen 10/10/2020.  CT 10/07/2020.

CLINICAL DATA: Small-bowel obstruction.

EXAM:
ABDOMEN - 1 VIEW

[abdomen supine grid (1 of 2)]
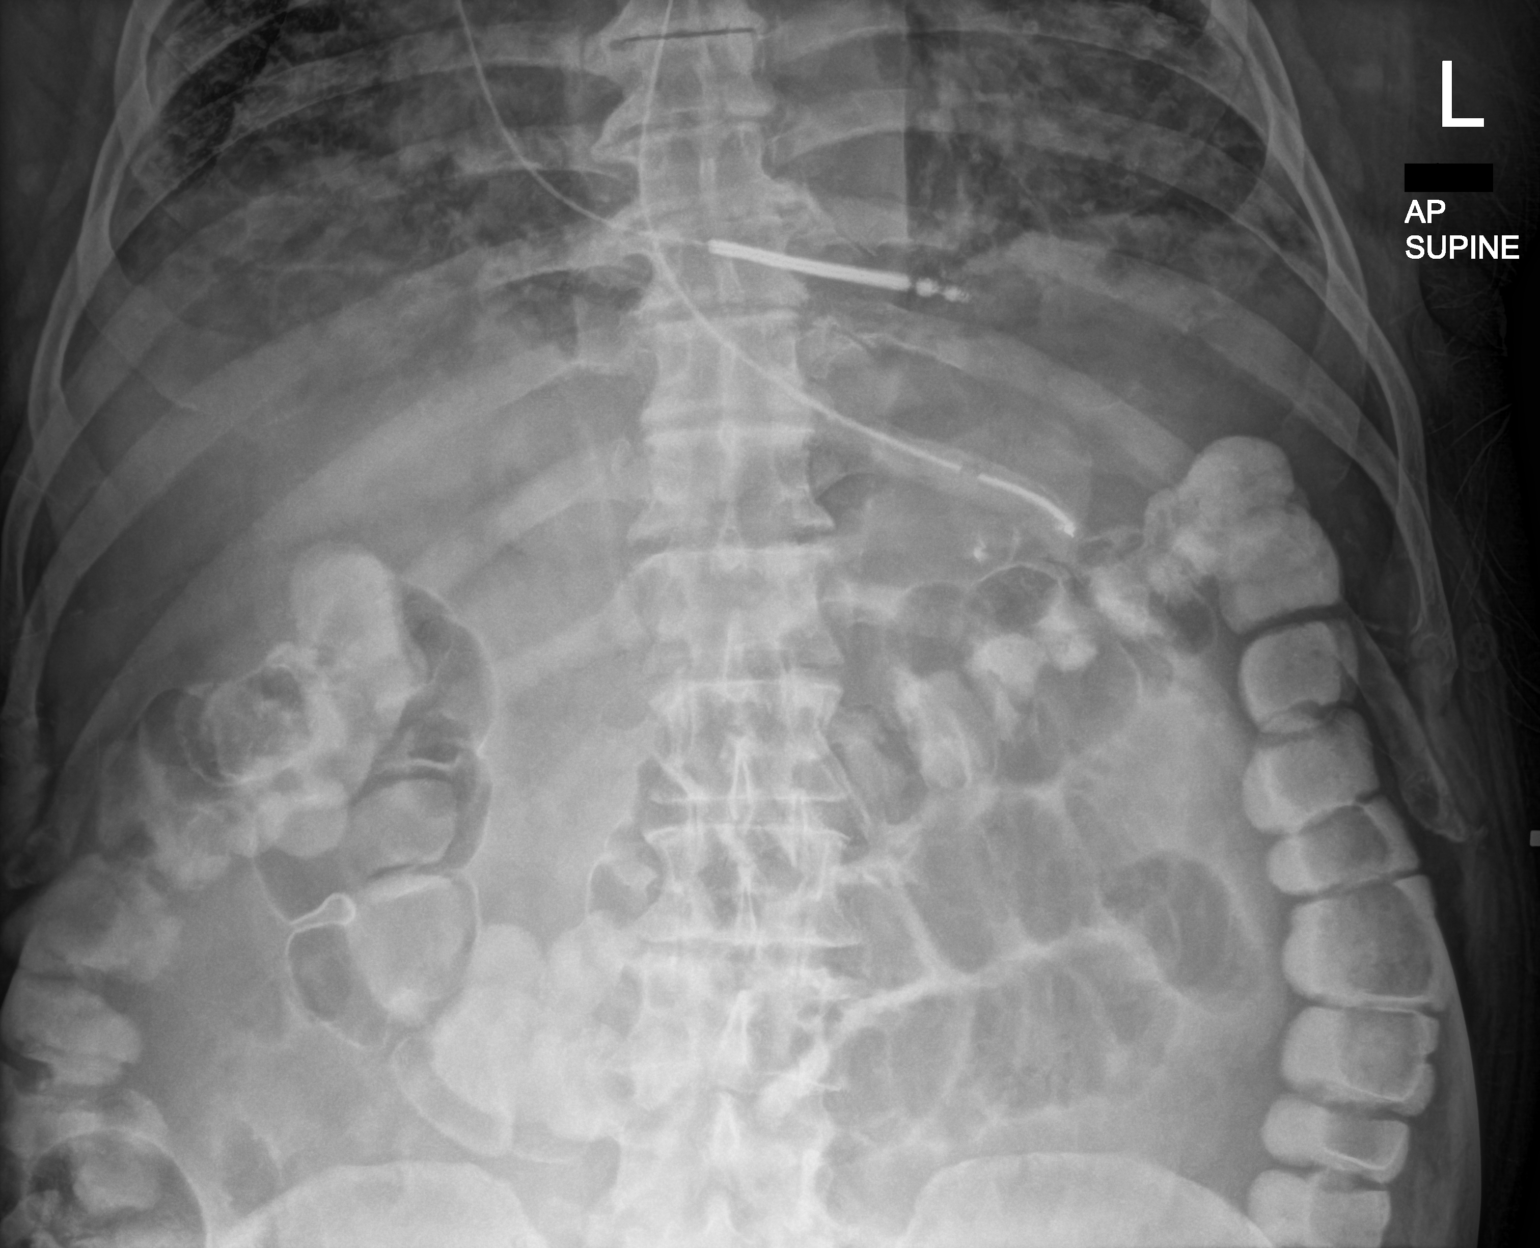

[abdomen supine grid (2 of 2)]
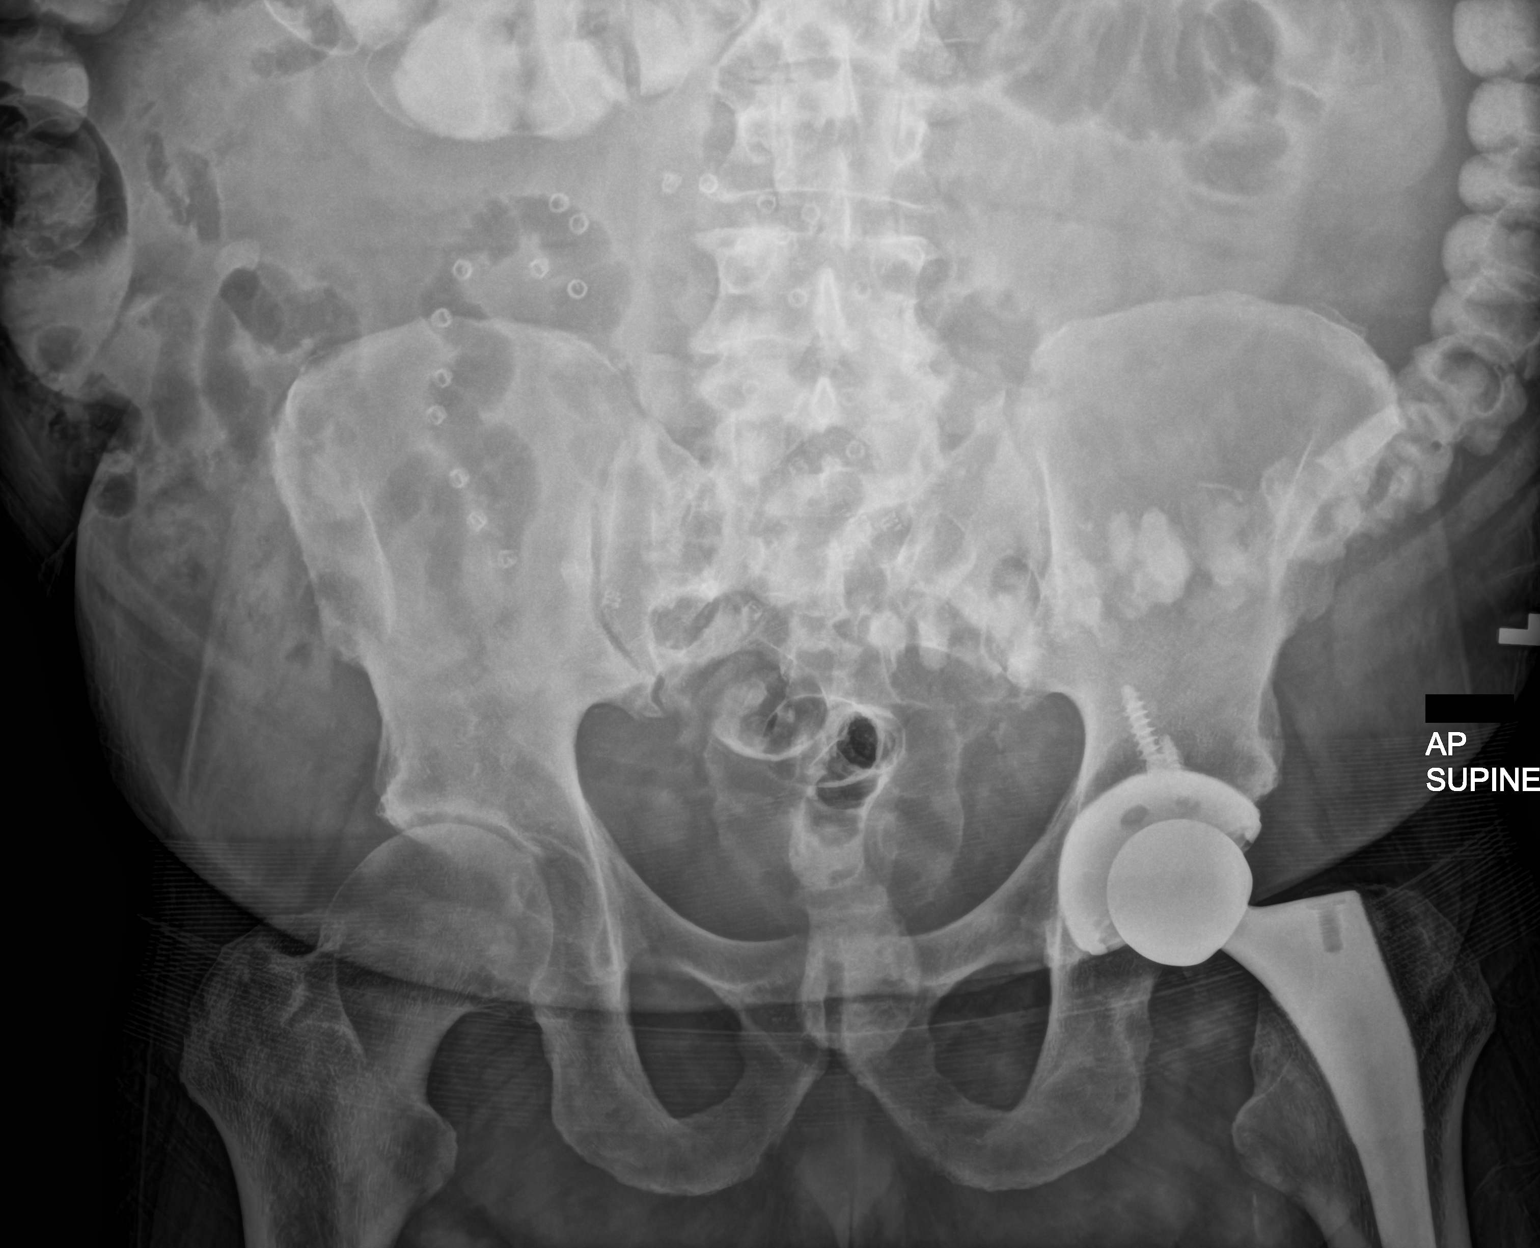

[2 of 2 positions shown; findings below may reference images not displayed]

FINDINGS: NG tube in stable position with tip and side hole of the upper
stomach. Persistent dilated loops of small bowel. Oral contrast
again noted the colon. Findings again consistent with persistent
partial small bowel obstruction. No free air. Degenerative change
lumbar spine. Total left hip replacement. Surgical clips noted over
the lower abdomen. Pacemaker lead in stable position. Bibasilar
atelectasis/infiltrates again noted.
IMPRESSION: 1.  NG tube in stable position.

2. Persistent dilated loops of small bowel. Oral contrast again
noted the colon. Findings again consistent persistent small-bowel
obstruction. No interim change.
